# Patient Record
Sex: Female | Born: 1980 | Race: Black or African American | Hispanic: No | Marital: Single | Smoking: Never smoker
Health system: Southern US, Community
[De-identification: ages and names within clinical notes are randomized; demographics above are authoritative.]

## PROBLEM LIST (undated history)

## (undated) ENCOUNTER — Inpatient Hospital Stay (HOSPITAL_COMMUNITY): Payer: Self-pay

## (undated) DIAGNOSIS — G43909 Migraine, unspecified, not intractable, without status migrainosus: Secondary | ICD-10-CM

## (undated) DIAGNOSIS — Z789 Other specified health status: Secondary | ICD-10-CM

## (undated) DIAGNOSIS — Q8501 Neurofibromatosis, type 1: Secondary | ICD-10-CM

## (undated) DIAGNOSIS — G43009 Migraine without aura, not intractable, without status migrainosus: Secondary | ICD-10-CM

## (undated) HISTORY — PX: DILATION AND CURETTAGE OF UTERUS: SHX78

## (undated) HISTORY — PX: TONSILLECTOMY: SUR1361

## (undated) HISTORY — DX: Neurofibromatosis, type 1: Q85.01

## (undated) HISTORY — DX: Migraine, unspecified, not intractable, without status migrainosus: G43.909

## (undated) HISTORY — DX: Migraine without aura, not intractable, without status migrainosus: G43.009

## (undated) HISTORY — PX: NASAL FRACTURE SURGERY: SHX718

---

## 2009-02-24 ENCOUNTER — Ambulatory Visit (HOSPITAL_COMMUNITY): Admission: RE | Admit: 2009-02-24 | Discharge: 2009-02-24 | Payer: Self-pay | Admitting: Obstetrics and Gynecology

## 2010-06-06 LAB — CBC
HCT: 38.6 % (ref 36.0–46.0)
Hemoglobin: 12.8 g/dL (ref 12.0–15.0)
MCV: 87.6 fL (ref 78.0–100.0)
Platelets: 221 10*3/uL (ref 150–400)
RDW: 14.4 % (ref 11.5–15.5)
WBC: 5.4 10*3/uL (ref 4.0–10.5)

## 2010-06-10 ENCOUNTER — Emergency Department (HOSPITAL_COMMUNITY)
Admission: EM | Admit: 2010-06-10 | Discharge: 2010-06-10 | Disposition: A | Discharge status: Home / Self Care | Payer: Medicaid Other | Attending: Emergency Medicine | Admitting: Emergency Medicine

## 2010-06-10 DIAGNOSIS — R599 Enlarged lymph nodes, unspecified: Secondary | ICD-10-CM | POA: Insufficient documentation

## 2010-06-10 DIAGNOSIS — H9209 Otalgia, unspecified ear: Secondary | ICD-10-CM | POA: Insufficient documentation

## 2010-06-10 DIAGNOSIS — S139XXA Sprain of joints and ligaments of unspecified parts of neck, initial encounter: Secondary | ICD-10-CM | POA: Insufficient documentation

## 2010-06-10 DIAGNOSIS — R21 Rash and other nonspecific skin eruption: Secondary | ICD-10-CM | POA: Insufficient documentation

## 2010-06-10 DIAGNOSIS — L293 Anogenital pruritus, unspecified: Secondary | ICD-10-CM | POA: Insufficient documentation

## 2010-06-10 DIAGNOSIS — N949 Unspecified condition associated with female genital organs and menstrual cycle: Secondary | ICD-10-CM | POA: Insufficient documentation

## 2010-06-10 DIAGNOSIS — X503XXA Overexertion from repetitive movements, initial encounter: Secondary | ICD-10-CM | POA: Insufficient documentation

## 2010-06-10 DIAGNOSIS — M542 Cervicalgia: Secondary | ICD-10-CM | POA: Insufficient documentation

## 2010-06-12 LAB — WOUND CULTURE

## 2010-09-25 ENCOUNTER — Emergency Department (HOSPITAL_COMMUNITY)
Admission: EM | Admit: 2010-09-25 | Discharge: 2010-09-25 | Disposition: A | Discharge status: Home / Self Care | Payer: No Typology Code available for payment source | Attending: Emergency Medicine | Admitting: Emergency Medicine

## 2010-09-25 DIAGNOSIS — S335XXA Sprain of ligaments of lumbar spine, initial encounter: Secondary | ICD-10-CM | POA: Insufficient documentation

## 2010-11-22 ENCOUNTER — Emergency Department (HOSPITAL_COMMUNITY)
Admission: EM | Admit: 2010-11-22 | Discharge: 2010-11-23 | Disposition: A | Discharge status: Home / Self Care | Payer: Medicaid Other | Attending: Emergency Medicine | Admitting: Emergency Medicine

## 2010-11-22 DIAGNOSIS — R51 Headache: Secondary | ICD-10-CM | POA: Insufficient documentation

## 2010-11-22 DIAGNOSIS — H53149 Visual discomfort, unspecified: Secondary | ICD-10-CM | POA: Insufficient documentation

## 2010-11-22 DIAGNOSIS — J3489 Other specified disorders of nose and nasal sinuses: Secondary | ICD-10-CM | POA: Insufficient documentation

## 2011-12-07 LAB — OB RESULTS CONSOLE ABO/RH

## 2011-12-07 LAB — OB RESULTS CONSOLE GC/CHLAMYDIA
Chlamydia: NEGATIVE
Gonorrhea: NEGATIVE

## 2011-12-07 LAB — OB RESULTS CONSOLE RUBELLA ANTIBODY, IGM: Rubella: IMMUNE

## 2011-12-07 LAB — OB RESULTS CONSOLE ANTIBODY SCREEN: Antibody Screen: NEGATIVE

## 2011-12-07 LAB — OB RESULTS CONSOLE HEPATITIS B SURFACE ANTIGEN: Hepatitis B Surface Ag: NEGATIVE

## 2011-12-26 ENCOUNTER — Encounter (HOSPITAL_COMMUNITY): Payer: Self-pay | Admitting: Neurology

## 2011-12-26 ENCOUNTER — Emergency Department (HOSPITAL_COMMUNITY)
Admission: EM | Admit: 2011-12-26 | Discharge: 2011-12-26 | Disposition: A | Discharge status: Home / Self Care | Payer: Medicaid Other | Attending: Emergency Medicine | Admitting: Emergency Medicine

## 2011-12-26 DIAGNOSIS — O210 Mild hyperemesis gravidarum: Secondary | ICD-10-CM | POA: Insufficient documentation

## 2011-12-26 DIAGNOSIS — R1110 Vomiting, unspecified: Secondary | ICD-10-CM

## 2011-12-26 DIAGNOSIS — O2620 Pregnancy care for patient with recurrent pregnancy loss, unspecified trimester: Secondary | ICD-10-CM | POA: Insufficient documentation

## 2011-12-26 DIAGNOSIS — R109 Unspecified abdominal pain: Secondary | ICD-10-CM | POA: Insufficient documentation

## 2011-12-26 DIAGNOSIS — Z3490 Encounter for supervision of normal pregnancy, unspecified, unspecified trimester: Secondary | ICD-10-CM

## 2011-12-26 LAB — URINALYSIS, ROUTINE W REFLEX MICROSCOPIC
Bilirubin Urine: NEGATIVE
Ketones, ur: NEGATIVE mg/dL
Leukocytes, UA: NEGATIVE
Nitrite: NEGATIVE
Urobilinogen, UA: 0.2 mg/dL (ref 0.0–1.0)
pH: 6.5 (ref 5.0–8.0)

## 2011-12-26 LAB — POCT I-STAT 3, ART BLOOD GAS (G3+)
Acid-base deficit: 6 mmol/L — ABNORMAL HIGH (ref 0.0–2.0)
Bicarbonate: 20.6 mEq/L (ref 20.0–24.0)
O2 Saturation: 74 %
TCO2: 22 mmol/L (ref 0–100)
pO2, Arterial: 45 mmHg — ABNORMAL LOW (ref 80.0–100.0)

## 2011-12-26 LAB — D-DIMER, QUANTITATIVE: D-Dimer, Quant: <0.27 ug/mL-FEU (ref 0.00–0.48)

## 2011-12-26 LAB — POCT I-STAT, CHEM 8
Calcium, Ion: 1.23 mmol/L (ref 1.12–1.23)
Chloride: 104 mEq/L (ref 96–112)
Glucose, Bld: 89 mg/dL (ref 70–99)
HCT: 39 % (ref 36.0–46.0)
Hemoglobin: 13.3 g/dL (ref 12.0–15.0)
TCO2: 23 mmol/L (ref 0–100)

## 2011-12-26 LAB — URINE MICROSCOPIC-ADD ON

## 2011-12-26 NOTE — ED Notes (Signed)
Pt reporting [redacted] weeks pregnant, abdominal cramping since early this morning, denying any vaginal bleeding. C/o nausea this morning worse than normal. Reporting small amount of blood in emesis this morning. 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

## 2011-12-26 NOTE — ED Provider Notes (Signed)
History     CSN: [id number]  Arrival date & time 12/26/11  9147   First MD Initiated Contact with Patient 12/26/11 [phone]      Chief Complaint  Patient presents with  . Abdominal Cramping    [redacted] weeks pregnant   . Morning Sickness    (Consider location/radiation/quality/duration/timing/severity/associated sxs/prior treatment) HPI Comments: Patient's 31 year old female G75P0 (ABO/RH- O Pos) presents emergency department opening of abdominal cramping and morning emesis.  Patient states concerned because of her previous miscarriages.  She denies any vaginal bleeding, abdominal pain, fever, night sweats, chills, dysuria, urinary frequency, vaginal discharge, syncope, headache, or lightheadedness.  Patient has no other complaints at this time.  The history is provided by the patient.    History reviewed. No pertinent past medical history.  Past Surgical History  Procedure Date  . Dilation and curettage of uterus   . Tonsillectomy     No family history on file.  History  Substance Use Topics  . Smoking status: Never Smoker   . Smokeless tobacco: Not on file  . Alcohol Use: No    OB History    Grav Para Term Preterm Abortions TAB SAB Ect Mult Living   1               Review of Systems  Constitutional: Negative for fever, chills and appetite change.  HENT: Negative for congestion.   Eyes: Negative for visual disturbance.  Respiratory: Negative for shortness of breath.   Cardiovascular: Negative for chest pain and leg swelling.  Gastrointestinal: Positive for nausea and vomiting. Negative for abdominal pain (+ for lower abdominal cramping ), diarrhea, constipation, blood in stool and anal bleeding.  Genitourinary: Negative for dysuria, urgency, frequency, vaginal bleeding, vaginal discharge and vaginal pain.  Neurological: Negative for dizziness, syncope, weakness, light-headedness, numbness and headaches.  Psychiatric/Behavioral: Negative for confusion.    Allergies    Review of patient's allergies indicates no known allergies.  Home Medications   Current Outpatient Rx  Name Route Sig Dispense Refill  . ACETAMINOPHEN 500 MG PO TABS Oral Take 1,000 mg by mouth every 6 (six) hours as needed. For pain    . PRENATAL MULTIVITAMIN CH Oral Take 1 tablet by mouth daily.    Marland Kitchen PROMETHAZINE HCL 25 MG PO TABS Oral Take 25 mg by mouth at bedtime as needed. For nausea      BP 110/72  Pulse 80  Temp 98 F (36.7 C) (Oral)  Resp 14  SpO2 100%  Physical Exam  Nursing note and vitals reviewed. Constitutional: She is oriented to person, place, and time. She appears well-developed and well-nourished. No distress.  HENT:  Head: Normocephalic and atraumatic.  Mouth/Throat: Oropharynx is clear and moist. No oropharyngeal exudate.  Eyes: Conjunctivae normal and EOM are normal. Pupils are equal, round, and reactive to light. No scleral icterus.  Neck: Normal range of motion. Neck supple. No tracheal deviation present. No thyromegaly present.  Cardiovascular: Normal rate, regular rhythm, normal heart sounds and intact distal pulses.   Pulmonary/Chest: Effort normal and breath sounds normal. No stridor. No respiratory distress. She has no wheezes.  Abdominal: Soft.       Soft, nontender  Genitourinary:       Pelvic, non tender exam with closed cervical os. No abnormal dc.   Musculoskeletal: Normal range of motion. She exhibits no edema and no tenderness.  Neurological: She is alert and oriented to person, place, and time. Coordination normal.  Skin: Skin is warm and dry. No rash  noted. She is not diaphoretic. No erythema. No pallor.  Psychiatric: She has a normal mood and affect. Her behavior is normal.    ED Course  [place] bedside Date/Time: 12/26/2011 10:57 AM Performed by: Jaci Carrel Authorized by: Jaci Carrel Risks and benefits: risks, benefits and alternatives were discussed Consent given by: patient Patient understanding: patient states understanding of the  procedure being performed Patient consent: the patient's understanding of the procedure matches consent given Patient identity confirmed: verbally with patient and arm band Local anesthesia used: no Patient sedated: no Comments: Intrauterine pregnancy seen. With HR appearing to be >150. Active fetal movement. Procedure performed with attending, Dr. Radford Pax   (including critical care time)  Labs Reviewed  URINALYSIS, ROUTINE W REFLEX MICROSCOPIC - Abnormal; Notable for the following:    Color, Urine AMBER (*)  BIOCHEMICALS MAY BE AFFECTED BY COLOR   APPearance CLOUDY (*)     Specific Gravity, Urine 1.034 (*)     Hgb urine dipstick SMALL (*)     Protein, ur 30 (*)     All other components within normal limits  POCT I-STAT, CHEM 8 - Abnormal; Notable for the following:    BUN 4 (*)     All other components within normal limits  URINE MICROSCOPIC-ADD ON - Abnormal; Notable for the following:    Squamous Epithelial / LPF FEW (*)     Bacteria, UA FEW (*)     All other components within normal limits  D-DIMER, QUANTITATIVE   No results found.   No diagnosis found.  Component     Latest Ref Rng 02/24/2009  ABO/RH(D)      O POS    MDM  Abdominal cramping, nausea morning sickness  31 year old female G4 P0 presents emergency department [redacted] weeks pregnant concerned for abdominal cramping and nausea.  Patient denies any vaginal bleeding.  On exam cervical os closed, no abdominal tenderness and intrauterine pregnancy seen using bedside ultrasound.  Patient has been advised to followup with her OB/GYN. [facility] incase other concerns present throughout pregnancy. Pt is agreeable with plan and appears reliable for follow up          Jaci Carrel, PA-C 12/26/11 1121

## 2011-12-28 NOTE — ED Provider Notes (Signed)
Medical screening examination/treatment/procedure(s) were performed by non-physician practitioner and as supervising physician I was immediately available for consultation/collaboration.    Nelia Shi, MD 12/28/11 [phone]

## 2012-03-06 NOTE — L&D Delivery Note (Signed)
Delivery Note  SVD viable female Apgars 8,9 over 2nd degree ML lac.  Repair with 2-0 Chromic with good support and hemostasis noted and R/V exam confirms.  PH art was sent.  [place] cord blood was not done After 30 mnutes of traction,and uterine massage, the placenta had not budged.  Had cord avulse at perineum due to traction.  Pt unable to tolerate manual extraction due to discomfort and no epidural so R&B discussed and informed consent obtained for D&C for retained placenta.  Bleeding at this time is minimal.  Mother and baby were doing well.  EBL 350cc  Candice Camp, MD

## 2012-03-29 ENCOUNTER — Inpatient Hospital Stay (HOSPITAL_COMMUNITY)
Admission: AD | Admit: 2012-03-29 | Discharge: 2012-03-29 | Disposition: A | Discharge status: Home / Self Care | Payer: Medicaid Other | Source: Ambulatory Visit | Attending: Obstetrics and Gynecology | Admitting: Obstetrics and Gynecology

## 2012-03-29 ENCOUNTER — Encounter (HOSPITAL_COMMUNITY): Payer: Self-pay | Admitting: *Deleted

## 2012-03-29 DIAGNOSIS — O99891 Other specified diseases and conditions complicating pregnancy: Secondary | ICD-10-CM | POA: Insufficient documentation

## 2012-03-29 DIAGNOSIS — J069 Acute upper respiratory infection, unspecified: Secondary | ICD-10-CM | POA: Insufficient documentation

## 2012-03-29 LAB — URINALYSIS, ROUTINE W REFLEX MICROSCOPIC
Glucose, UA: NEGATIVE mg/dL
Ketones, ur: NEGATIVE mg/dL
Leukocytes, UA: NEGATIVE
Protein, ur: NEGATIVE mg/dL
pH: 6 (ref 5.0–8.0)

## 2012-03-29 LAB — URINE MICROSCOPIC-ADD ON

## 2012-03-29 MED ORDER — DIPHENHYDRAMINE HCL 25 MG PO CAPS: 50.0000 mg | ORAL_CAPSULE | Freq: Every evening | ORAL | Status: DC | PRN

## 2012-03-29 NOTE — MAU Provider Note (Signed)
Chief Complaint:  Influenza and Back Pain  First Provider Initiated Contact with Patient 03/29/12 2226     HPI: Melissa Mathews is a 32 y.o. G1P0 at [redacted]w[redacted]d who presents to maternity admissions reporting sore throat, fatigue, nasal congestion, cough, sneezing, dull HA. Denies fever, body aches, N/V/D, contractions, leakage of fluid, vaginal bleeding. Family members have had similar Sx, no fever. Good fetal movement. Called physicians or [facility]: Sx. Instructed to take Mucinex. To improvement.   Past Medical History: No past medical history on file.  Past obstetric history: OB History    Grav Para Term Preterm Abortions TAB SAB Ect Mult Living   6 2 2  3  3   2      # Outc Date GA Lbr Len/2nd Wgt Sex Del Anes PTL Lv   1 TRM 1996    F SVD None  Yes   2 TRM 2000    F SVD None  Yes   3 SAB            4 SAB            5 SAB            6 CUR               Past Surgical History: Past Surgical History  Procedure Date  . Dilation and curettage of uterus   . Tonsillectomy     Family History: No family history on file.  Social History: History  Substance Use Topics  . Smoking status: Never Smoker   . Smokeless tobacco: Not on file  . Alcohol Use: No    Allergies: No Known Allergies  Meds:  No prescriptions prior to admission    ROS: Pertinent findings in history of present illness.  Physical Exam  Blood pressure 120/61, pulse 79, temperature 98.1 F (36.7 C), temperature source Oral, resp. rate 20, height 5\' 2"  (1.575 m), weight 64.864 kg (143 lb). GENERAL: Well-developed, well-nourished female in mild distress.  HEENT: normocephalic. + rhinorrhea, nasal congestion, mild frontal sinus tenderness, mild throat erythema. Infrequent coughing. HEART: normal rate and rhythm, systolic murmur RESP: normal effort, lungs CTAB ABDOMEN: Soft, non-tender, gravid appropriate for gestational age EXTREMITIES: Nontender, no edema NEURO: alert and oriented SPECULUM EXAM: deferred   FHT:  152   Labs: Results for orders placed during the hospital encounter of 03/29/12 (from the past 24 hour(s))  URINALYSIS, ROUTINE W REFLEX MICROSCOPIC     Status: Abnormal   Collection Time   03/29/12 10:05 PM      Component Value Range   Color, Urine YELLOW  YELLOW   APPearance CLEAR  CLEAR   Specific Gravity, Urine >1.030 (*) 1.005 - 1.030   pH 6.0  5.0 - 8.0   Glucose, UA NEGATIVE  NEGATIVE mg/dL   Hgb urine dipstick TRACE (*) NEGATIVE   Bilirubin Urine NEGATIVE  NEGATIVE   Ketones, ur NEGATIVE  NEGATIVE mg/dL   Protein, ur NEGATIVE  NEGATIVE mg/dL   Urobilinogen, UA 1.0  0.0 - 1.0 mg/dL   Nitrite NEGATIVE  NEGATIVE   Leukocytes, UA NEGATIVE  NEGATIVE  URINE MICROSCOPIC-ADD ON     Status: Abnormal   Collection Time   03/29/12 10:05 PM      Component Value Range   Squamous Epithelial / LPF RARE  RARE   WBC, UA 0-2  <3 WBC/hpf   RBC / HPF 3-6  <3 RBC/hpf   Bacteria, UA FEW (*) RARE    Imaging:  No  results found.  MAU Course: Does not meet criteria for influenza. Discussed w/ Dr. Vincente Poli. No further orders.  Assessment: 1. URI (upper respiratory infection)   2. Other current maternal conditions classifiable elsewhere, antepartum     Plan: Discharge home Continue Mucinex. Discussed how it works. Benadryl for sleep. Increase rest and fluids. Preterm labor precautions and fetal kick counts     Follow-up Information    Follow up with GREWAL,MICHELLE L, MD. (as scheduled. Call the office if you develop a fever greater than 100.4.)    Contact information:   [address] [place] [address] [phone]           Medication List     As of 03/30/2012  2:21 AM    TAKE these medications         acetaminophen 500 MG tablet   Commonly known as: TYLENOL   Take 1,000 mg by mouth every 6 (six) hours as needed. For pain      diphenhydrAMINE 25 mg capsule   Commonly known as: BENADRYL   Take 2 capsules (50 mg total) by mouth at bedtime as needed for  sleep.      guaiFENesin 600 MG 12 hr tablet   Commonly known as: MUCINEX   Take 1,200 mg by mouth 2 (two) times daily. For cold symptoms      prenatal multivitamin Tabs   Take 1 tablet by mouth daily.         [place], CNM 03/29/2012 11:42 PM

## 2012-03-29 NOTE — MAU Note (Signed)
Having flu-like symptoms. Sore throat, cough, sneezing, sinus area pressure and h/a. Low back pain

## 2012-03-30 ENCOUNTER — Encounter (HOSPITAL_COMMUNITY): Payer: Self-pay | Admitting: Advanced Practice Midwife

## 2012-07-21 ENCOUNTER — Inpatient Hospital Stay (HOSPITAL_COMMUNITY)
Admission: AD | Admit: 2012-07-21 | Discharge: 2012-07-22 | Disposition: A | Discharge status: Home / Self Care | Payer: Medicaid Other | Source: Ambulatory Visit | Attending: Obstetrics and Gynecology | Admitting: Obstetrics and Gynecology

## 2012-07-21 ENCOUNTER — Encounter (HOSPITAL_COMMUNITY): Payer: Self-pay | Admitting: *Deleted

## 2012-07-21 DIAGNOSIS — O479 False labor, unspecified: Secondary | ICD-10-CM | POA: Insufficient documentation

## 2012-07-21 DIAGNOSIS — H538 Other visual disturbances: Secondary | ICD-10-CM | POA: Insufficient documentation

## 2012-07-21 DIAGNOSIS — R51 Headache: Secondary | ICD-10-CM | POA: Insufficient documentation

## 2012-07-21 DIAGNOSIS — O99891 Other specified diseases and conditions complicating pregnancy: Secondary | ICD-10-CM | POA: Insufficient documentation

## 2012-07-21 HISTORY — DX: Other specified health status: Z78.9

## 2012-07-21 LAB — URINALYSIS, ROUTINE W REFLEX MICROSCOPIC
Bilirubin Urine: NEGATIVE
Ketones, ur: NEGATIVE mg/dL
Leukocytes, UA: NEGATIVE
Nitrite: NEGATIVE
Specific Gravity, Urine: 1.025 (ref 1.005–1.030)
Urobilinogen, UA: 0.2 mg/dL (ref 0.0–1.0)
pH: 6.5 (ref 5.0–8.0)

## 2012-07-21 NOTE — MAU Provider Note (Signed)
History     CSN: [id number]  Arrival date and time: 07/21/12 2121   First Provider Initiated Contact with Patient 07/21/12 2233      Chief Complaint  Patient presents with  . Headache   HPI [name] is a 32 y.o. [id number] at [redacted]w[redacted]d who presents to MAU today with complaint of headache and blurred vision. The patient reports seeing spots x 2 weeks. Was told in office recently that this may be of concern if she developed headache. Headache started around 8 pm as did the blurry vision. She rates her pain at 2/10 upon arrival in MAU and notes improvement since then. She denies a history of HTN. She denies abdominal pain, vaginal bleeding or LOF. She had some mucus discharge over the weekend that has stopped. She is having some pressure in her lower abdomen that is unchanged since previously. She states that she has swelling in her extremities. She has occasional contractions. None today. Reports good fetal movement.    OB History   Grav Para Term Preterm Abortions TAB SAB Ect Mult Living   6 2 2  3  3   2       Past Medical History  Diagnosis Date  . Medical history non-contributory     Past Surgical History  Procedure Laterality Date  . Dilation and curettage of uterus    . Tonsillectomy      No family history on file.  History  Substance Use Topics  . Smoking status: Never Smoker   . Smokeless tobacco: Not on file  . Alcohol Use: No    Allergies: No Known Allergies  No prescriptions prior to admission    Review of Systems  Constitutional: Negative for fever and malaise/fatigue.  Eyes: Positive for blurred vision.  Gastrointestinal: Positive for nausea. Negative for vomiting, abdominal pain, diarrhea and constipation.  Genitourinary: Negative for dysuria, urgency and frequency.       Neg - vaginal bleeding, LOF + vaginal discharge  Neurological: Positive for dizziness and headaches. Negative for loss of consciousness.   Physical Exam   Blood pressure  109/61, pulse 82, temperature 98.4 F (36.9 C), temperature source Oral, resp. rate 18, height 5\' 2"  (1.575 m), weight 166 lb (75.297 kg), SpO2 100.00%.  Physical Exam  Constitutional: She appears well-developed and well-nourished. No distress.  HENT:  Head: Normocephalic and atraumatic.  Cardiovascular: Normal rate, regular rhythm and normal heart sounds.   Respiratory: Effort normal and breath sounds normal. No respiratory distress.  GI: Soft. Bowel sounds are normal. She exhibits no distension and no mass. There is no tenderness. There is no rebound and no guarding.  Neurological: She is alert. She has normal reflexes.  No clonus  Skin: Skin is warm and dry.  Psychiatric: She has a normal mood and affect.   Results for orders placed during the hospital encounter of 07/21/12 (from the past 24 hour(s))  URINALYSIS, ROUTINE W REFLEX MICROSCOPIC     Status: None   Collection Time    07/21/12  9:34 PM      Result Value Range   Color, Urine YELLOW  YELLOW   APPearance CLEAR  CLEAR   Specific Gravity, Urine 1.025  1.005 - 1.030   pH 6.5  5.0 - 8.0   Glucose, UA NEGATIVE  NEGATIVE mg/dL   Hgb urine dipstick NEGATIVE  NEGATIVE   Bilirubin Urine NEGATIVE  NEGATIVE   Ketones, ur NEGATIVE  NEGATIVE mg/dL   Protein, ur NEGATIVE  NEGATIVE mg/dL  Urobilinogen, UA 0.2  0.0 - 1.0 mg/dL   Nitrite NEGATIVE  NEGATIVE   Leukocytes, UA NEGATIVE  NEGATIVE   Fetal monitoring: Baseline: 120 bpm, moderate variability, + accelerations, no decelerations Contractions: few, occasional  MAU Course  Procedures None  MDM Discussed with Dr. Thana Ates. Ok to discharge. Follow-up as scheduled  Assessment and Plan  A: Headache  P: Discharge home Patient instructed to take Tylenol PRN for headache Patient advised to follow-up in the office as scheduled Pre-clampsia warning signs reviewed and given on AVS Labor precautions reviewed Patient may return to MAU as needed or if her condition were to  change or worsen  Freddi Starr, PA-C  07/22/2012, 12:30 AM

## 2012-07-21 NOTE — MAU Note (Signed)
Pt reports she has a headache on the left side of her head for the last hour, has had blurred vision and flashes of light for a week or more. Denies B/P problems with preg.

## 2012-07-24 ENCOUNTER — Inpatient Hospital Stay (HOSPITAL_COMMUNITY)
Admission: RE | Admit: 2012-07-24 | Discharge: 2012-07-27 | DRG: 767 | Disposition: A | Discharge status: Home / Self Care | Payer: Medicaid Other | Source: Ambulatory Visit | Attending: Obstetrics and Gynecology | Admitting: Obstetrics and Gynecology

## 2012-07-24 ENCOUNTER — Encounter (HOSPITAL_COMMUNITY): Payer: Self-pay | Admitting: *Deleted

## 2012-07-24 DIAGNOSIS — O4100X0 Oligohydramnios, unspecified trimester, not applicable or unspecified: Principal | ICD-10-CM | POA: Diagnosis present

## 2012-07-24 DIAGNOSIS — Z9889 Other specified postprocedural states: Secondary | ICD-10-CM

## 2012-07-24 LAB — CBC
MCH: 29.3 pg (ref 26.0–34.0)
MCHC: 34.2 g/dL (ref 30.0–36.0)
MCV: 85.6 fL (ref 78.0–100.0)
Platelets: 203 10*3/uL (ref 150–400)
RBC: 4.1 MIL/uL (ref 3.87–5.11)

## 2012-07-24 MED ORDER — LACTATED RINGERS IV SOLN
INTRAVENOUS | Status: DC
  Administered 2012-07-24 – 2012-07-25 (×3): via INTRAVENOUS

## 2012-07-24 MED ORDER — TERBUTALINE SULFATE 1 MG/ML IJ SOLN: 0.2500 mg | Freq: Once | INTRAMUSCULAR | Status: AC | PRN

## 2012-07-24 MED ORDER — OXYTOCIN BOLUS FROM INFUSION: 500.0000 mL | INTRAVENOUS | Status: DC

## 2012-07-24 MED ORDER — OXYCODONE-ACETAMINOPHEN 5-325 MG PO TABS: 1.0000 | ORAL_TABLET | ORAL | Status: DC | PRN

## 2012-07-24 MED ORDER — LIDOCAINE HCL (PF) 1 % IJ SOLN
30.0000 mL | INTRAMUSCULAR | Status: DC | PRN
  Filled 2012-07-24: qty 30

## 2012-07-24 MED ORDER — ACETAMINOPHEN 325 MG PO TABS: 650.0000 mg | ORAL_TABLET | ORAL | Status: DC | PRN

## 2012-07-24 MED ORDER — FLEET ENEMA 7-19 GM/118ML RE ENEM: 1.0000 | ENEMA | RECTAL | Status: DC | PRN

## 2012-07-24 MED ORDER — CITRIC ACID-SODIUM CITRATE 334-500 MG/5ML PO SOLN
30.0000 mL | ORAL | Status: DC | PRN
  Administered 2012-07-25: 30 mL via ORAL
  Filled 2012-07-24: qty 15

## 2012-07-24 MED ORDER — ONDANSETRON HCL 4 MG/2ML IJ SOLN: 4.0000 mg | Freq: Four times a day (QID) | INTRAMUSCULAR | Status: DC | PRN

## 2012-07-24 MED ORDER — IBUPROFEN 600 MG PO TABS: 600.0000 mg | ORAL_TABLET | Freq: Four times a day (QID) | ORAL | Status: DC | PRN

## 2012-07-24 MED ORDER — MISOPROSTOL 25 MCG QUARTER TABLET
25.0000 ug | ORAL_TABLET | ORAL | Status: DC | PRN
  Administered 2012-07-24: 25 ug via VAGINAL
  Filled 2012-07-24: qty 0.25

## 2012-07-24 MED ORDER — OXYTOCIN 40 UNITS IN LACTATED RINGERS INFUSION - SIMPLE MED: 62.5000 mL/h | INTRAVENOUS | Status: DC

## 2012-07-24 MED ORDER — ZOLPIDEM TARTRATE 5 MG PO TABS
5.0000 mg | ORAL_TABLET | Freq: Every evening | ORAL | Status: DC | PRN
  Administered 2012-07-24: 5 mg via ORAL
  Filled 2012-07-24: qty 1

## 2012-07-24 MED ORDER — LACTATED RINGERS IV SOLN
500.0000 mL | INTRAVENOUS | Status: DC | PRN
  Administered 2012-07-24 – 2012-07-25 (×2): 300 mL via INTRAVENOUS

## 2012-07-24 NOTE — Progress Notes (Signed)
H and E dictated, pt at term with AFI< 3% for induction

## 2012-07-25 ENCOUNTER — Encounter (HOSPITAL_COMMUNITY): Payer: Self-pay | Admitting: Anesthesiology

## 2012-07-25 ENCOUNTER — Encounter (HOSPITAL_COMMUNITY)
Admission: RE | Disposition: A | Discharge status: Home / Self Care | Payer: Self-pay | Source: Ambulatory Visit | Attending: Obstetrics and Gynecology

## 2012-07-25 ENCOUNTER — Inpatient Hospital Stay (HOSPITAL_COMMUNITY): Payer: Medicaid Other | Admitting: Anesthesiology

## 2012-07-25 HISTORY — PX: DILATION AND CURETTAGE OF UTERUS: SHX78

## 2012-07-25 LAB — CBC
HCT: 30.2 % — ABNORMAL LOW (ref 36.0–46.0)
Hemoglobin: 10.1 g/dL — ABNORMAL LOW (ref 12.0–15.0)
MCH: 28.9 pg (ref 26.0–34.0)
MCV: 86.3 fL (ref 78.0–100.0)
RBC: 3.5 MIL/uL — ABNORMAL LOW (ref 3.87–5.11)
WBC: 17.3 10*3/uL — ABNORMAL HIGH (ref 4.0–10.5)

## 2012-07-25 LAB — TYPE AND SCREEN

## 2012-07-25 SURGERY — DILATION AND CURETTAGE
Anesthesia: General | Site: Vagina | Wound class: Clean Contaminated

## 2012-07-25 MED ORDER — ONDANSETRON HCL 4 MG/2ML IJ SOLN
INTRAMUSCULAR | Status: DC | PRN
  Administered 2012-07-25: 4 mg via INTRAVENOUS

## 2012-07-25 MED ORDER — PRENATAL MULTIVITAMIN CH: 1.0000 | ORAL_TABLET | Freq: Every day | ORAL | Status: DC

## 2012-07-25 MED ORDER — ONDANSETRON HCL 4 MG/2ML IJ SOLN
4.0000 mg | Freq: Four times a day (QID) | INTRAMUSCULAR | Status: DC | PRN
Start: 2012-07-25 — End: 2012-07-25

## 2012-07-25 MED ORDER — ONDANSETRON HCL 4 MG PO TABS: 4.0000 mg | ORAL_TABLET | ORAL | Status: DC | PRN

## 2012-07-25 MED ORDER — OXYTOCIN 40 UNITS IN LACTATED RINGERS INFUSION - SIMPLE MED
1.0000 m[IU]/min | INTRAVENOUS | Status: DC
  Administered 2012-07-25: 2 m[IU]/min via INTRAVENOUS
  Filled 2012-07-25: qty 1000

## 2012-07-25 MED ORDER — DIPHENHYDRAMINE HCL 50 MG/ML IJ SOLN: 12.5000 mg | Freq: Four times a day (QID) | INTRAMUSCULAR | Status: DC | PRN

## 2012-07-25 MED ORDER — IBUPROFEN 600 MG PO TABS
600.0000 mg | ORAL_TABLET | Freq: Four times a day (QID) | ORAL | Status: DC
  Administered 2012-07-25 – 2012-07-27 (×6): 600 mg via ORAL
  Filled 2012-07-25 (×6): qty 1

## 2012-07-25 MED ORDER — SUCCINYLCHOLINE CHLORIDE 20 MG/ML IJ SOLN
INTRAMUSCULAR | Status: AC
  Filled 2012-07-25: qty 10

## 2012-07-25 MED ORDER — SENNOSIDES-DOCUSATE SODIUM 8.6-50 MG PO TABS
2.0000 | ORAL_TABLET | Freq: Every day | ORAL | Status: DC
  Administered 2012-07-26 (×2): 2 via ORAL

## 2012-07-25 MED ORDER — TERBUTALINE SULFATE 1 MG/ML IJ SOLN
INTRAMUSCULAR | Status: AC
  Administered 2012-07-25: 0.25 mg via SUBCUTANEOUS
  Filled 2012-07-25: qty 1

## 2012-07-25 MED ORDER — DIPHENHYDRAMINE HCL 12.5 MG/5ML PO ELIX
12.5000 mg | ORAL_SOLUTION | Freq: Four times a day (QID) | ORAL | Status: DC | PRN
  Filled 2012-07-25: qty 5

## 2012-07-25 MED ORDER — LIDOCAINE HCL (CARDIAC) 20 MG/ML IV SOLN
INTRAVENOUS | Status: DC | PRN
  Administered 2012-07-25: 50 mg via INTRAVENOUS

## 2012-07-25 MED ORDER — DIPHENHYDRAMINE HCL 25 MG PO CAPS: 25.0000 mg | ORAL_CAPSULE | Freq: Four times a day (QID) | ORAL | Status: DC | PRN

## 2012-07-25 MED ORDER — ZOLPIDEM TARTRATE 5 MG PO TABS: 5.0000 mg | ORAL_TABLET | Freq: Every evening | ORAL | Status: DC | PRN

## 2012-07-25 MED ORDER — NALOXONE HCL 0.4 MG/ML IJ SOLN: 0.4000 mg | INTRAMUSCULAR | Status: DC | PRN

## 2012-07-25 MED ORDER — LIDOCAINE HCL (CARDIAC) 20 MG/ML IV SOLN
INTRAVENOUS | Status: AC
  Filled 2012-07-25: qty 5

## 2012-07-25 MED ORDER — WITCH HAZEL-GLYCERIN EX PADS: 1.0000 "application " | MEDICATED_PAD | CUTANEOUS | Status: DC | PRN

## 2012-07-25 MED ORDER — PHENYLEPHRINE 40 MCG/ML (10ML) SYRINGE FOR IV PUSH (FOR BLOOD PRESSURE SUPPORT): 80.0000 ug | PREFILLED_SYRINGE | INTRAVENOUS | Status: DC | PRN

## 2012-07-25 MED ORDER — PHENYLEPHRINE 40 MCG/ML (10ML) SYRINGE FOR IV PUSH (FOR BLOOD PRESSURE SUPPORT)
80.0000 ug | PREFILLED_SYRINGE | INTRAVENOUS | Status: DC | PRN
  Filled 2012-07-25: qty 5

## 2012-07-25 MED ORDER — CEFAZOLIN SODIUM-DEXTROSE 2-3 GM-% IV SOLR
INTRAVENOUS | Status: DC | PRN
  Administered 2012-07-25: 2 g via INTRAVENOUS

## 2012-07-25 MED ORDER — PRENATAL MULTIVITAMIN CH
1.0000 | ORAL_TABLET | Freq: Every day | ORAL | Status: DC
  Administered 2012-07-26: 1 via ORAL
  Filled 2012-07-25: qty 1

## 2012-07-25 MED ORDER — EPHEDRINE 5 MG/ML INJ
10.0000 mg | INTRAVENOUS | Status: DC | PRN
  Filled 2012-07-25: qty 4

## 2012-07-25 MED ORDER — TERBUTALINE SULFATE 1 MG/ML IJ SOLN: 0.2500 mg | Freq: Once | INTRAMUSCULAR | Status: DC | PRN

## 2012-07-25 MED ORDER — TERBUTALINE SULFATE 1 MG/ML IJ SOLN: 0.2500 mg | Freq: Once | INTRAMUSCULAR | Status: AC

## 2012-07-25 MED ORDER — OXYTOCIN 10 UNIT/ML IJ SOLN
INTRAMUSCULAR | Status: AC
  Filled 2012-07-25: qty 2

## 2012-07-25 MED ORDER — OXYCODONE-ACETAMINOPHEN 5-325 MG PO TABS
1.0000 | ORAL_TABLET | ORAL | Status: DC | PRN
  Administered 2012-07-25 – 2012-07-26 (×2): 2 via ORAL
  Administered 2012-07-26 (×2): 1 via ORAL
  Administered 2012-07-27: 2 via ORAL
  Filled 2012-07-25: qty 2
  Filled 2012-07-25 (×2): qty 1
  Filled 2012-07-25: qty 2
  Filled 2012-07-25 (×2): qty 1

## 2012-07-25 MED ORDER — PROPOFOL 10 MG/ML IV EMUL
INTRAVENOUS | Status: AC
  Filled 2012-07-25: qty 20

## 2012-07-25 MED ORDER — LACTATED RINGERS IV SOLN
INTRAVENOUS | Status: DC | PRN
  Administered 2012-07-25: 17:00:00 via INTRAVENOUS

## 2012-07-25 MED ORDER — PHENYLEPHRINE HCL 10 MG/ML IJ SOLN
INTRAMUSCULAR | Status: DC | PRN
  Administered 2012-07-25: 80 ug via INTRAVENOUS

## 2012-07-25 MED ORDER — FENTANYL CITRATE 0.05 MG/ML IJ SOLN
INTRAMUSCULAR | Status: AC
  Administered 2012-07-25: 50 ug via INTRAVENOUS
  Filled 2012-07-25: qty 2

## 2012-07-25 MED ORDER — SUCCINYLCHOLINE CHLORIDE 20 MG/ML IJ SOLN
INTRAMUSCULAR | Status: DC | PRN
  Administered 2012-07-25: 120 mg via INTRAVENOUS

## 2012-07-25 MED ORDER — TETANUS-DIPHTH-ACELL PERTUSSIS 5-2.5-18.5 LF-MCG/0.5 IM SUSP: 0.5000 mL | Freq: Once | INTRAMUSCULAR | Status: DC

## 2012-07-25 MED ORDER — OXYTOCIN 10 UNIT/ML IJ SOLN
INTRAMUSCULAR | Status: DC | PRN
  Administered 2012-07-25: 20 [IU] via INTRAMUSCULAR

## 2012-07-25 MED ORDER — PHENYLEPHRINE 40 MCG/ML (10ML) SYRINGE FOR IV PUSH (FOR BLOOD PRESSURE SUPPORT)
PREFILLED_SYRINGE | INTRAVENOUS | Status: AC
  Filled 2012-07-25: qty 5

## 2012-07-25 MED ORDER — LANOLIN HYDROUS EX OINT: TOPICAL_OINTMENT | CUTANEOUS | Status: DC | PRN

## 2012-07-25 MED ORDER — FENTANYL CITRATE 0.05 MG/ML IJ SOLN
50.0000 ug | Freq: Once | INTRAMUSCULAR | Status: AC
  Administered 2012-07-25: 50 ug via INTRAVENOUS
  Filled 2012-07-25: qty 2

## 2012-07-25 MED ORDER — METHYLERGONOVINE MALEATE 0.2 MG/ML IJ SOLN
INTRAMUSCULAR | Status: DC | PRN
  Administered 2012-07-25: 0.2 mg via INTRAMUSCULAR

## 2012-07-25 MED ORDER — EPHEDRINE 5 MG/ML INJ: 10.0000 mg | INTRAVENOUS | Status: DC | PRN

## 2012-07-25 MED ORDER — DIPHENHYDRAMINE HCL 50 MG/ML IJ SOLN: 12.5000 mg | INTRAMUSCULAR | Status: DC | PRN

## 2012-07-25 MED ORDER — BENZOCAINE-MENTHOL 20-0.5 % EX AERO
1.0000 "application " | INHALATION_SPRAY | CUTANEOUS | Status: DC | PRN
  Administered 2012-07-26: 1 via TOPICAL
  Filled 2012-07-25: qty 56

## 2012-07-25 MED ORDER — SODIUM CHLORIDE 0.9 % IJ SOLN: 9.0000 mL | INTRAMUSCULAR | Status: DC | PRN

## 2012-07-25 MED ORDER — MEASLES, MUMPS & RUBELLA VAC ~~LOC~~ INJ: 0.5000 mL | INJECTION | Freq: Once | SUBCUTANEOUS | Status: DC

## 2012-07-25 MED ORDER — FENTANYL CITRATE 0.05 MG/ML IJ SOLN
INTRAMUSCULAR | Status: AC
  Filled 2012-07-25: qty 2

## 2012-07-25 MED ORDER — FENTANYL 10 MCG/ML IV SOLN
INTRAVENOUS | Status: DC
  Administered 2012-07-25: 14:00:00 via INTRAVENOUS
  Filled 2012-07-25: qty 50

## 2012-07-25 MED ORDER — METHYLERGONOVINE MALEATE 0.2 MG/ML IJ SOLN
INTRAMUSCULAR | Status: AC
  Filled 2012-07-25: qty 1

## 2012-07-25 MED ORDER — FENTANYL CITRATE 0.05 MG/ML IJ SOLN
INTRAMUSCULAR | Status: DC | PRN
  Administered 2012-07-25 (×3): 50 ug via INTRAVENOUS

## 2012-07-25 MED ORDER — MIDAZOLAM HCL 5 MG/5ML IJ SOLN
INTRAMUSCULAR | Status: DC | PRN
  Administered 2012-07-25: 1 mg via INTRAVENOUS

## 2012-07-25 MED ORDER — PROPOFOL 10 MG/ML IV BOLUS
INTRAVENOUS | Status: DC | PRN
  Administered 2012-07-25: 180 mg via INTRAVENOUS

## 2012-07-25 MED ORDER — ONDANSETRON HCL 4 MG/2ML IJ SOLN: 4.0000 mg | INTRAMUSCULAR | Status: DC | PRN

## 2012-07-25 MED ORDER — FENTANYL 2.5 MCG/ML BUPIVACAINE 1/10 % EPIDURAL INFUSION (WH - ANES)
14.0000 mL/h | INTRAMUSCULAR | Status: DC | PRN
  Filled 2012-07-25: qty 125

## 2012-07-25 MED ORDER — ONDANSETRON HCL 4 MG/2ML IJ SOLN
INTRAMUSCULAR | Status: AC
  Filled 2012-07-25: qty 2

## 2012-07-25 MED ORDER — MEDROXYPROGESTERONE ACETATE 150 MG/ML IM SUSP: 150.0000 mg | INTRAMUSCULAR | Status: DC | PRN

## 2012-07-25 MED ORDER — LACTATED RINGERS IV SOLN
INTRAVENOUS | Status: DC
  Administered 2012-07-25 (×2): via INTRAUTERINE

## 2012-07-25 MED ORDER — CEFAZOLIN SODIUM-DEXTROSE 2-3 GM-% IV SOLR
INTRAVENOUS | Status: AC
  Filled 2012-07-25: qty 50

## 2012-07-25 MED ORDER — DIBUCAINE 1 % RE OINT
1.0000 "application " | TOPICAL_OINTMENT | RECTAL | Status: DC | PRN
  Administered 2012-07-27: 1 via RECTAL
  Filled 2012-07-25: qty 28

## 2012-07-25 MED ORDER — MIDAZOLAM HCL 2 MG/2ML IJ SOLN
INTRAMUSCULAR | Status: AC
  Filled 2012-07-25: qty 2

## 2012-07-25 MED ORDER — FENTANYL CITRATE 0.05 MG/ML IJ SOLN: 25.0000 ug | INTRAMUSCULAR | Status: DC | PRN

## 2012-07-25 MED ORDER — SIMETHICONE 80 MG PO CHEW: 80.0000 mg | CHEWABLE_TABLET | ORAL | Status: DC | PRN

## 2012-07-25 MED ORDER — LACTATED RINGERS IV SOLN: 500.0000 mL | Freq: Once | INTRAVENOUS | Status: DC

## 2012-07-25 SURGICAL SUPPLY — 19 items
CATH ROBINSON RED A/P 16FR (CATHETERS) ×3 IMPLANT
CLOTH BEACON ORANGE TIMEOUT ST (SAFETY) ×3 IMPLANT
DECANTER SPIKE VIAL GLASS SM (MISCELLANEOUS) ×3 IMPLANT
GLOVE BIO SURGEON STRL SZ8 (GLOVE) ×3 IMPLANT
GLOVE SURG ORTHO 8.0 STRL STRW (GLOVE) ×3 IMPLANT
GOWN STRL REIN XL XLG (GOWN DISPOSABLE) ×6 IMPLANT
KIT BERKELEY 1ST TRIMESTER 3/8 (MISCELLANEOUS) ×3 IMPLANT
NEEDLE SPNL 22GX3.5 QUINCKE BK (NEEDLE) ×3 IMPLANT
NS IRRIG 1000ML POUR BTL (IV SOLUTION) ×3 IMPLANT
PACK VAGINAL MINOR WOMEN LF (CUSTOM PROCEDURE TRAY) ×3 IMPLANT
PAD OB MATERNITY 4.3X12.25 (PERSONAL CARE ITEMS) ×3 IMPLANT
PAD PREP 24X48 CUFFED NSTRL (MISCELLANEOUS) ×3 IMPLANT
SET BERKELEY SUCTION TUBING (SUCTIONS) ×3 IMPLANT
SYR CONTROL 10ML LL (SYRINGE) ×3 IMPLANT
TOWEL OR 17X24 6PK STRL BLUE (TOWEL DISPOSABLE) ×6 IMPLANT
VACURETTE 10 RIGID CVD (CANNULA) IMPLANT
VACURETTE 7MM CVD STRL WRAP (CANNULA) IMPLANT
VACURETTE 8 RIGID CVD (CANNULA) IMPLANT
VACURETTE 9 RIGID CVD (CANNULA) IMPLANT

## 2012-07-25 NOTE — Progress Notes (Signed)
Patient ID: Melissa Mathews, female   DOB: 04-May-1980, 32 y.o.   MRN: [id number] Pt with only mild ctxs VSSAF FHR with late decels and decrease variablity thru night responded to IVF, Terb and dc cytotec FHR now with occas accels, no decels Ctxs mild q 5-10  Cx  1-2/50/-2 AROM minimal fluid IUPC placed  Oligo induction at term FHR reassuring at this time Augment with pitocin Consider amnioinfusion DL

## 2012-07-25 NOTE — Anesthesia Postprocedure Evaluation (Signed)
  Anesthesia Post-op Note  Patient: Melissa Mathews  Procedure(s) Performed: Procedure(s): DILATATION AND CURETTAGE (N/A) Patient is awake and responsive. Pain and nausea are reasonably well controlled. Vital signs are stable and clinically acceptable. Oxygen saturation is clinically acceptable. There are no apparent anesthetic complications at this time. Patient is ready for discharge.

## 2012-07-25 NOTE — Brief Op Note (Signed)
07/24/2012 - 07/25/2012  5:12 PM  PATIENT:  Melissa Mathews  32 y.o. female  PRE-OPERATIVE DIAGNOSIS:  retained placenta  POST-OPERATIVE DIAGNOSIS:  retained placenta  PROCEDURE:  Procedure(s): DILATATION AND EVACUATION (N/A)  SURGEON:  Surgeon(s) and Role:    * Turner Daniels, MD - Primary  PHYSICIAN ASSISTANT:   ASSISTANTS: none   ANESTHESIA:   general  EBL:  Total I/O In: 700 [I.V.:700] Out: 1850 [Urine:50; Blood:1800]  BLOOD ADMINISTERED:none  DRAINS: none   LOCAL MEDICATIONS USED:  NONE  SPECIMEN:  Source of Specimen:  placenta  DISPOSITION OF SPECIMEN:  PATHOLOGY  COUNTS:  YES  TOURNIQUET:  * No tourniquets in log *  DICTATION: .Other Dictation: Dictation Number 1  PLAN OF CARE: Admit to inpatient   PATIENT DISPOSITION:  PACU - hemodynamically stable.   Delay start of Pharmacological VTE agent (>24hrs) due to surgical blood loss or risk of bleeding: no

## 2012-07-25 NOTE — Transfer of Care (Signed)
Immediate Anesthesia Transfer of Care Note  Patient: Melissa Mathews  Procedure(s) Performed: Procedure(s): DILATATION AND EVACUATION (N/A)  Patient Location: PACU  Anesthesia Type:General  Level of Consciousness: awake  Airway & Oxygen Therapy: Patient Spontanous Breathing  Post-op Assessment: Report given to PACU RN  Post vital signs: stable  Filed Vitals:   07/25/12 1546  BP:   Pulse: 113  Temp:   Resp:     Complications: No apparent anesthesia complications

## 2012-07-25 NOTE — Anesthesia Preprocedure Evaluation (Signed)
Anesthesia Evaluation Anesthesia Physical Anesthesia Plan  ASA: II  Anesthesia Plan: Epidural   Post-op Pain Management:    Induction:   Airway Management Planned:   Additional Equipment:   Intra-op Plan:   Post-operative Plan:   Informed Consent: I have reviewed the patients History and Physical, chart, labs and discussed the procedure including the risks, benefits and alternatives for the proposed anesthesia with the patient or authorized representative who has indicated his/her understanding and acceptance.   Dental Advisory Given  Plan Discussed with:   Anesthesia Plan Comments: (Labs checked- platelets confirmed with RN in room. Fetal heart tracing, per RN, reported to be stable enough for sitting procedure. Discussed epidural, and patient consents to the procedure:  included risk of possible headache,backache, failed block, allergic reaction, and nerve injury. This patient was asked if she had any questions or concerns before the procedure started.)        Anesthesia Quick Evaluation  

## 2012-07-25 NOTE — Progress Notes (Signed)
Patient ID: Melissa Mathews, female   DOB: March 01, 1981, 32 y.o.   MRN: [id number] Waiting for OR for retained placenta due to another retained placenta ahead of [place], called to room for gush of blood -500cc.  Massaged with small clots, and called or and immediately went for D&C (stat). In OR total of 1000cc by time intubated and D&C started.  With 300cc from delivery, total EBL 1300cc OP note dictated DL

## 2012-07-25 NOTE — Anesthesia Preprocedure Evaluation (Signed)
Anesthesia Evaluation  Patient identified by MRN, date of birth, ID band Patient awake    Reviewed: Allergy & Precautions, H&P , Patient's Chart, lab work & pertinent test results, reviewed documented beta blocker date and time   Airway Mallampati: II TM Distance: >3 FB Neck ROM: full    Dental no notable dental hx.    Pulmonary  breath sounds clear to auscultation  Pulmonary exam normal       Cardiovascular Rhythm:regular Rate:Normal     Neuro/Psych    GI/Hepatic   Endo/Other    Renal/GU      Musculoskeletal   Abdominal   Peds  Hematology   Anesthesia Other Findings   Reproductive/Obstetrics                           Anesthesia Physical Anesthesia Plan  ASA: II and emergent  Anesthesia Plan: General   Post-op Pain Management:    Induction: Intravenous  Airway Management Planned: Oral ETT  Additional Equipment:   Intra-op Plan:   Post-operative Plan:   Informed Consent: I have reviewed the patients History and Physical, chart, labs and discussed the procedure including the risks, benefits and alternatives for the proposed anesthesia with the patient or authorized representative who has indicated his/her understanding and acceptance.   Dental Advisory Given and Dental advisory given  Plan Discussed with: CRNA and Surgeon  Anesthesia Plan Comments: (  Discussed  general anesthesia, including possible nausea, instrumentation of airway, sore throat,pulmonary aspiration, etc. I asked if the were any outstanding questions, or  concerns before we proceeded. )        Anesthesia Quick Evaluation

## 2012-07-25 NOTE — H&P (Signed)
NAMECHALET, KERWIN NO.:  [id number]  MEDICAL RECORD NO.:  [id number]  LOCATION:                                 FACILITY:  PHYSICIAN:  Duke Salvia. Marcelle Overlie, M.D.DATE OF BIRTH:  03-Jan-1981  DATE OF ADMISSION:  07/24/2012 DATE OF DISCHARGE:                             HISTORY & PHYSICAL   CHIEF COMPLAINT:  Low AFI at term.  HPI:  A 32 year old, G6, P2-0-3-2 at 39-6/7 weeks, was seen in the office today for NST and ultrasound NST was reactive.  Ultrasound BPP 6/8 vertex, however the AFI was less than the 3rd percentile, presents now for labor induction for low fluid at term, GBS negative.  1 hour GTT was normal.  Blood type is O positive.  PAST MEDICAL HISTORY:  Please see her [facility] form for the remainder of her past medical history.  PHYSICAL EXAMINATION:  Temp 98.2, blood pressure 130/70.  HEENT: Unremarkable.  NECK:  Supple without masses.  LUNGS:  Clear. CARDIOVASCULAR:  Regular rhythm without murmurs, rubs, or gallops. Breasts not examined.  Term fundal height.  Fetal heart rate 140, cervix was closed, soft vertex.  Extremities and neurologic exam are unremarkable.  IMPRESSION:  Oligohydramnios at term.  PLAN:  Two-stage labor induction procedure and protocol reviewed with the patient.     Kimberlly Norgard M. Marcelle Overlie, M.D.     [facility]/MEDQ  D:  07/24/2012  T:  07/24/2012  Job:  [id number]

## 2012-07-26 ENCOUNTER — Encounter (HOSPITAL_COMMUNITY): Payer: Self-pay | Admitting: *Deleted

## 2012-07-26 LAB — CBC
HCT: 24.1 % — ABNORMAL LOW (ref 36.0–46.0)
Hemoglobin: 8.1 g/dL — ABNORMAL LOW (ref 12.0–15.0)
MCH: 28.9 pg (ref 26.0–34.0)
MCHC: 33.6 g/dL (ref 30.0–36.0)
MCV: 86.1 fL (ref 78.0–100.0)
RDW: 16.1 % — ABNORMAL HIGH (ref 11.5–15.5)

## 2012-07-26 MED ORDER — FERROUS SULFATE 325 (65 FE) MG PO TABS
325.0000 mg | ORAL_TABLET | Freq: Three times a day (TID) | ORAL | Status: DC
  Administered 2012-07-26 – 2012-07-27 (×4): 325 mg via ORAL
  Filled 2012-07-26 (×4): qty 1

## 2012-07-26 NOTE — Progress Notes (Signed)
UR chart review completed.  

## 2012-07-26 NOTE — Op Note (Signed)
NAMESHERROL, VICARS NO.:  [id number]  MEDICAL RECORD NO.:  [id number]  LOCATION:  9105                          FACILITY:  [facility]  PHYSICIAN:  Dineen Kid. Rana Snare, M.D.    DATE OF BIRTH:  1981-02-13  DATE OF PROCEDURE:  07/25/2012 DATE OF DISCHARGE:                              OPERATIVE REPORT   PREOPERATIVE DIAGNOSIS:  Retained placenta status post vaginal delivery and also vaginal bleeding.  POSTOPERATIVE DIAGNOSIS:  Retained placenta status post vaginal delivery and also vaginal bleeding.  PROCEDURE:  Dilation and evacuation.  SURGEON:  Dineen Kid. Rana Snare, M.D.  ANESTHESIA:  General endotracheal.  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  Retained placenta for approximately 30 minutes, now with an avulsed cord after trying to remove.  Previous history of D and C in the past with no __________ the placenta __________ extracted manually due to the patient's discomfort.  The patient elects for operating room for general anesthesia and removal of retained placenta. We were asked to wait due to the operating room busy with another retained placenta, but after about 10 minutes of waiting, the patient began to have vaginal bleeding so we proceeded in a stat fashion.  At the time of presentation to the operating room, there was approximately 1000 mL of the original 500 mL in the recovery room, another 200 to 300 when we entered the operating room, and then 200 evacuation __________ dilation and curettage.  She had 300 mL total from the delivery, so the total blood loss today will be 1300 mL.  DESCRIPTION OF PROCEDURE:  After adequate analgesia, the patient had been placed in dorsal lithotomy position.  She was sterilely prepped and draped.  Bladder was sterilely drained.  After massaging the uterus to firm, a weighted speculum was placed, Sims  retractor.  The anterior lip of the cervix was grasped with a ring forceps.  The edge of the placenta was grasped with a ring forceps as well.  Operator's fingertips were inserted and I was able to separate the placenta from the uterine wall with the fingers and tease the edge of the placenta through the cervix while grasping it with ring forceps.  After delineating the plan with the operator's fingertips and gentle traction with ring forceps, I was able to completely remove the placenta, the cord had been avulsed, but there was approximately 20-30 cm of cord left at this time.  After complete removal of the placenta, manual exam under anesthesia of the uterus was normal with no palpable products remained,  gentle sharp curettage was carried out in addition, and no products were removed at this time.  The uterus was massaged to firm.  The patient was given Methergine 0.2 mg IM with good uterine response and the Pitocin was started with minimal bleeding noted.  The small separation of the episiotomy repair was reapproximated with interrupted suture of 2-0 chromic with good perineal repair noted and minimal bleeding noted.  The patient was then given 2 g of Ancef, and transferred to the recovery room in stable condition.  Total blood  loss including the vaginal delivery, the bleeding before the operating room, and in the operating room was 1300 mL.  We will check a hemoglobin in the recovery room before transfer to the floor.  At this time, she is stable.     Dineen Kid Rana Snare, M.D.     DCL/MEDQ  D:  07/25/2012  T:  07/26/2012  Job:  [id number]

## 2012-07-26 NOTE — Progress Notes (Signed)
Post Partum Day 1 and post op D and E  Subjective: up ad lib, voiding and tolerating PO  Objective: Blood pressure 95/56, pulse 103, temperature 98.2 F (36.8 C), temperature source Oral, resp. rate 16, height 5\' 2"  (1.575 m), weight 165 lb (74.844 kg), SpO2 97.00%, unknown if currently breastfeeding.  Physical Exam:  General: alert and cooperative Lochia: appropriate Uterine Fundus: firm Incision: perineum intact DVT Evaluation: No evidence of DVT seen on physical exam. Negative Homan's sign. No cords or calf tenderness. No significant calf/ankle edema.   Recent Labs  07/25/12 1750 07/26/12 0605  HGB 10.1* 8.1*  HCT 30.2* 24.1*    Assessment/Plan: Plan for discharge tomorrow FeSo4   LOS: 2 days   Melissa Mathews G 07/26/2012, 8:00 AM

## 2012-07-27 DIAGNOSIS — Z9889 Other specified postprocedural states: Secondary | ICD-10-CM

## 2012-07-27 LAB — CBC
MCH: 29.1 pg (ref 26.0–34.0)
MCHC: 33 g/dL (ref 30.0–36.0)
MCV: 88 fL (ref 78.0–100.0)
Platelets: 178 10*3/uL (ref 150–400)
RBC: 2.58 MIL/uL — ABNORMAL LOW (ref 3.87–5.11)

## 2012-07-27 MED ORDER — OXYCODONE-ACETAMINOPHEN 7.5-325 MG PO TABS: 1.0000 | ORAL_TABLET | ORAL | Status: DC | PRN

## 2012-07-27 NOTE — Discharge Summary (Signed)
Obstetric Discharge Summary Reason for Admission: induction of labor for oligohydramios Prenatal Procedures: ultrasound Intrapartum Procedures: spontaneous vaginal delivery Postpartum Procedures: curettage Complications-Operative and Postpartum: second degree perineal laceration and retained placenta Hemoglobin  Date Value Range Status  07/27/2012 7.5* 12.0 - 15.0 g/dL Final     HCT  Date Value Range Status  07/27/2012 22.7* 36.0 - 46.0 % Final    Physical Exam:  General: alert Lochia: appropriate Uterine Fundus: firm Incision: healing well DVT Evaluation: No evidence of DVT seen on physical exam.  Discharge Diagnoses: Term Pregnancy-delivered  Discharge Information: Date: 07/27/2012 Activity: pelvic rest Diet: routine Medications: PNV, Iron and Percocet Condition: stable Instructions: refer to practice specific booklet Discharge to: home   Newborn Data: Live born female  Birth Weight: 8 lb 4.3 oz (3750 g) APGAR: 8, 9  Home with mother.  Sheronda Parran S 07/27/2012, 9:39 AM

## 2012-07-27 NOTE — Progress Notes (Signed)
Post Partum Day two Subjective: no complaints  Objective: Blood pressure 90/52, pulse 73, temperature 98.1 F (36.7 C), temperature source Oral, resp. rate 16, height 5\' 2"  (1.575 m), weight 74.844 kg (165 lb), SpO2 97.00%, unknown if currently breastfeeding.  Physical Exam:  General: alert Lochia: appropriate Uterine Fundus: firm Incision: healing well DVT Evaluation: No evidence of DVT seen on physical exam.   Recent Labs  07/26/12 0605 07/27/12 0612  HGB 8.1* 7.5*  HCT 24.1* 22.7*    Assessment/Plan: Discharge home   LOS: 3 days   Hadi Dubin S 07/27/2012, 9:38 AM

## 2012-11-19 ENCOUNTER — Encounter (HOSPITAL_COMMUNITY): Payer: Self-pay | Admitting: Emergency Medicine

## 2012-11-19 ENCOUNTER — Emergency Department (HOSPITAL_COMMUNITY)
Admission: EM | Admit: 2012-11-19 | Discharge: 2012-11-19 | Disposition: A | Discharge status: Home / Self Care | Payer: Medicaid Other | Source: Home / Self Care | Attending: Emergency Medicine | Admitting: Emergency Medicine

## 2012-11-19 DIAGNOSIS — J069 Acute upper respiratory infection, unspecified: Secondary | ICD-10-CM

## 2012-11-19 DIAGNOSIS — M549 Dorsalgia, unspecified: Secondary | ICD-10-CM

## 2012-11-19 MED ORDER — HYDROCODONE-ACETAMINOPHEN 5-325 MG PO TABS: 2.0000 | ORAL_TABLET | ORAL | Status: DC | PRN

## 2012-11-19 MED ORDER — METHOCARBAMOL 500 MG PO TABS: 500.0000 mg | ORAL_TABLET | Freq: Two times a day (BID) | ORAL | Status: DC

## 2012-11-19 NOTE — ED Provider Notes (Signed)
CSN: [id number]     Arrival date & time 11/19/12  1834 History   First MD Initiated Contact with Patient 11/19/12 2020     Chief Complaint  Patient presents with  . Back Injury    last weekend during [employer] training.   Marland Kitchen URI    ha sore throat.  cough. sinus pressure and pain.    (Consider location/radiation/quality/duration/timing/severity/associated sxs/prior Treatment) Patient is a 32 y.o. female presenting with URI and back pain. The history is provided by the patient.  URI Presenting symptoms: congestion and cough   Severity:  Mild Onset quality:  Gradual Duration:  1 day Timing:  Constant Progression:  Worsening Chronicity:  New Relieved by:  Nothing Worsened by:  Nothing tried Associated symptoms: arthralgias   Back Pain Quality:  Aching Radiates to:  Does not radiate Pain severity:  Moderate Pain is:  Same all the time Onset quality:  Sudden Duration:  1 week Timing:  Constant Progression:  Worsening Chronicity:  New Worsened by:  Nothing tried Ineffective treatments:  None tried Associated symptoms: no abdominal pain     Past Medical History  Diagnosis Date  . Medical history non-contributory    Past Surgical History  Procedure Laterality Date  . Dilation and curettage of uterus    . Tonsillectomy    . Dilation and curettage of uterus N/A 07/25/2012    Procedure: DILATATION AND CURETTAGE;  Surgeon: Turner Daniels, MD;  Location: [facility];  Service: Gynecology;  Laterality: N/A;   History reviewed. No pertinent family history. History  Substance Use Topics  . Smoking status: Never Smoker   . Smokeless tobacco: Not on file  . Alcohol Use: No   OB History   Grav Para Term Preterm Abortions TAB SAB Ect Mult Living   6 3 3  3  3   3      Review of Systems  HENT: Positive for congestion.   Respiratory: Positive for cough.   Gastrointestinal: Negative for abdominal pain.  Musculoskeletal: Positive for back pain and arthralgias.  All other systems  reviewed and are negative.    Allergies  Review of patient's allergies indicates no known allergies.  Home Medications   Current Outpatient Rx  Name  Route  Sig  Dispense  Refill  . Prenatal Vit-Fe Fumarate-FA (PRENATAL MULTIVITAMIN) TABS   Oral   Take 1 tablet by mouth daily.         Marland Kitchen acetaminophen (TYLENOL) 500 MG tablet   Oral   Take 1,000 mg by mouth every 6 (six) hours as needed. For pain         . HYDROcodone-acetaminophen (NORCO/VICODIN) 5-325 MG per tablet   Oral   Take 2 tablets by mouth every 4 (four) hours as needed for pain.   20 tablet   0   . IRON PO   Oral   Take 1 tablet by mouth every morning. Unknown strength, pt received as samples from clinic         . methocarbamol (ROBAXIN) 500 MG tablet   Oral   Take 1 tablet (500 mg total) by mouth 2 (two) times daily.   20 tablet   0   . oxyCODONE-acetaminophen (PERCOCET) 7.5-325 MG per tablet   Oral   Take 1 tablet by mouth every 4 (four) hours as needed for pain.   30 tablet   0    BP 134/84  Pulse 112  Temp(Src) 98.4 F (36.9 C) (Oral)  Resp 16  SpO2 98%  LMP  10/09/2012  Breastfeeding? No Physical Exam  Nursing note and vitals reviewed. Constitutional: She is oriented to person, place, and time. She appears well-developed and well-nourished.  HENT:  Head: Normocephalic and atraumatic.  Mouth/Throat: Oropharynx is clear and moist.  Eyes: Conjunctivae and EOM are normal. Pupils are equal, round, and reactive to light.  Neck: Normal range of motion. Neck supple.  Cardiovascular: Normal rate.   Pulmonary/Chest: Effort normal and breath sounds normal.  Abdominal: Soft. She exhibits no distension.  Musculoskeletal:  Tender lumbar spine and paravertebrals  Neurological: She is alert and oriented to person, place, and time.  Skin: Skin is warm.  Psychiatric: She has a normal mood and affect.    ED Course  Procedures (including critical care time) Labs Review Labs Reviewed - No data to  display Imaging Review No results found.  MDM   1. Back pain   2. URI (upper respiratory infection)    Robaxin and hydrocodone follow up with Dr. Roda Shutters if symptoms persist    Elson Areas, [place] 11/19/12 2028

## 2012-11-19 NOTE — ED Provider Notes (Signed)
Medical screening examination/treatment/procedure(s) were performed by non-physician practitioner and as supervising physician I was immediately available for consultation/collaboration.  Leslee Home, M.D.  Reuben Likes, MD 11/19/12 [id number]

## 2012-11-19 NOTE — ED Notes (Signed)
C/o injury to back during [employer]. ?'s possible strain. Has tried ibuprofen 800 mg with no relief.   Cold symptoms: sinus pressure and pain. Headache. Nonproductive cough. Stuffy nose. Onset yesterday. Pt is taking mucinex with no relief.   Denies fever n/v/d

## 2013-02-19 ENCOUNTER — Emergency Department (HOSPITAL_COMMUNITY)
Admission: EM | Admit: 2013-02-19 | Discharge: 2013-02-20 | Disposition: A | Discharge status: Home / Self Care | Payer: Medicaid Other | Attending: Emergency Medicine | Admitting: Emergency Medicine

## 2013-02-19 ENCOUNTER — Encounter (HOSPITAL_COMMUNITY): Payer: Self-pay | Admitting: Emergency Medicine

## 2013-02-19 DIAGNOSIS — Z791 Long term (current) use of non-steroidal anti-inflammatories (NSAID): Secondary | ICD-10-CM | POA: Insufficient documentation

## 2013-02-19 DIAGNOSIS — M25579 Pain in unspecified ankle and joints of unspecified foot: Secondary | ICD-10-CM | POA: Insufficient documentation

## 2013-02-19 DIAGNOSIS — S93401S Sprain of unspecified ligament of right ankle, sequela: Secondary | ICD-10-CM

## 2013-02-19 DIAGNOSIS — G8911 Acute pain due to trauma: Secondary | ICD-10-CM | POA: Insufficient documentation

## 2013-02-19 NOTE — ED Notes (Signed)
Pt has been training for the [employer], she states she injured her right ankle and it has not gotten any better. She states it has been hurting for 2 weeks, she was given ibuprofen and naprosyn , but she states it is still hurting.

## 2013-02-20 ENCOUNTER — Emergency Department (HOSPITAL_COMMUNITY): Payer: Medicaid Other

## 2013-02-20 NOTE — Progress Notes (Signed)
Orthopedic Tech Progress Note Patient Details:  Melissa Mathews 1980-09-11 [id number]  Ortho Devices Type of Ortho Device: ASO   Haskell Flirt 02/20/2013, 1:18 AM

## 2013-02-20 NOTE — ED Provider Notes (Signed)
Medical screening examination/treatment/procedure(s) were performed by non-physician practitioner and as supervising physician I was immediately available for consultation/collaboration.  EKG Interpretation   None        Tareek Sabo, MD 02/20/13 2249 

## 2013-02-20 NOTE — ED Provider Notes (Signed)
CSN: [id number]     Arrival date & time 02/19/13  2126 History   First MD Initiated Contact with Patient 02/19/13 2349     Chief Complaint  Patient presents with  . 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  She was seen at the medical clinic at that time.  Was told it was a sprain.  Put in a brace.  She continue to train on her arrival.  Home.  She was seen by her primary care physician, who again told her it was a sprain.  He prescribed Naprosyn.  At that time.  She presented to the emergency room tonight with persistent pain, although she is, and the tori.  There is no swelling, or discoloration  Patient is a 32 y.o. Mathews presenting with ankle pain. The history is provided by the patient.  Ankle Pain Location:  Ankle Time since incident:  2 weeks Injury: yes   Ankle location:  R ankle Pain details:    Quality:  Aching   Radiates to:  Does not radiate   Severity:  Mild   Onset quality:  Sudden   Duration:  2 weeks   Timing:  Constant   Progression:  Unchanged Chronicity:  New Dislocation: yes   Tetanus status:  Up to date Prior injury to area:  No Relieved by:  Nothing Worsened by:  Activity Ineffective treatments:  Acetaminophen and immobilization Associated symptoms: no fever     Past Medical History  Diagnosis Date  . Medical history non-contributory    Past Surgical History  Procedure Laterality Date  . Dilation and curettage of uterus    . Tonsillectomy    . Dilation and curettage of uterus N/A 07/25/2012    Procedure: DILATATION AND CURETTAGE;  Surgeon: Turner Daniels, MD;  Location: [facility];  Service: Gynecology;  Laterality: N/A;   History reviewed. No pertinent family history. History  Substance Use Topics  . Smoking status: Never Smoker   . Smokeless tobacco: Not on file  . Alcohol Use:  No   OB History   Grav Para Term Preterm Abortions TAB SAB Ect Mult Living   6 3 3  3  3   3      Review of Systems  Constitutional: Negative for fever.  Musculoskeletal: Negative for joint swelling.  Skin: Negative for color change.  Neurological: Negative for weakness and numbness.  All other systems reviewed and are negative.    Allergies  Review of patient's allergies indicates no known allergies.  Home Medications   Current Outpatient Rx  Name  Route  Sig  Dispense  Refill  . acetaminophen (TYLENOL) 500 MG tablet   Oral   Take 1,000 mg by mouth every 6 (six) hours as needed. For pain         . naproxen (NAPROSYN) 500 MG tablet   Oral   Take 500 mg by mouth 2 (two) times daily with a meal.          BP 124/77  Pulse 74  Temp(Src) Melissa.6 F (37 C) (Oral)  Resp 16  SpO2 100%  LMP 02/19/2013 Physical Exam  Nursing note and vitals reviewed. Constitutional: She appears well-developed and well-nourished.  Eyes: Pupils are equal, round, and reactive to light.  Neck: Normal range of motion.  Cardiovascular: Normal rate and regular rhythm.   Pulmonary/Chest: Effort normal.  Musculoskeletal: She exhibits no edema  and no tenderness.  Neurological: She is alert.  Skin: Skin is warm and dry.    ED Course  Procedures (including critical care time) Labs Review Labs Reviewed - No data to display Imaging Review Dg Foot Complete Right  02/20/2013   CLINICAL DATA:  Twist injury, pain and swelling at 5th metatarsal and lateral ankle  EXAM: RIGHT FOOT COMPLETE - 3+ VIEW  COMPARISON:  None  FINDINGS: Osseous mineralization normal.  Joint spaces preserved.  No fracture, dislocation, or bone destruction.  IMPRESSION: Normal exam.   Electronically Signed   By: Ulyses Southward M.D.   On: 02/20/2013 00:45    EKG Interpretation   None       MDM   1. Ankle sprain, right, sequela    Patient has been placed in a ASO, and given rehabilitation exercises.  She is to followup with  her primary care physician as needed     Arman Filter, NP 02/20/13 0121

## 2013-03-27 ENCOUNTER — Encounter: Payer: Self-pay | Admitting: Neurology

## 2013-03-31 ENCOUNTER — Ambulatory Visit (INDEPENDENT_AMBULATORY_CARE_PROVIDER_SITE_OTHER): Payer: Medicaid Other | Admitting: Neurology

## 2013-03-31 ENCOUNTER — Encounter (INDEPENDENT_AMBULATORY_CARE_PROVIDER_SITE_OTHER): Payer: Self-pay

## 2013-03-31 ENCOUNTER — Encounter: Payer: Self-pay | Admitting: Neurology

## 2013-03-31 VITALS — BP 131/66 | HR 89 | Ht 62.2 in | Wt 134.0 lb

## 2013-03-31 DIAGNOSIS — G43009 Migraine without aura, not intractable, without status migrainosus: Secondary | ICD-10-CM

## 2013-03-31 DIAGNOSIS — Q8501 Neurofibromatosis, type 1: Secondary | ICD-10-CM | POA: Insufficient documentation

## 2013-03-31 HISTORY — DX: Neurofibromatosis, type 1: Q85.01

## 2013-03-31 HISTORY — DX: Migraine without aura, not intractable, without status migrainosus: G43.009

## 2013-03-31 NOTE — Progress Notes (Signed)
Reason for visit: Neurofibromatosis  Melissa Mathews is a 33 y.o. female  History of present illness:  Melissa Mathews is a 33 year old right-handed black female with a history of neurofibromatosis type I. The patient has very prominent family history of this disease that is found in the maternal great-grandfather, maternal  grandmother, mother, and in one brother. The patient has some neurofibroma on the arms and legs. The patient has one area below the left knee that is tender to touch, but otherwise this does not cause her any discomfort. The patient has no weakness of the extremities related to the neurofibroma. The patient indicates that she has not had any recent head scan evaluations, and she does have a history of migraine headache. The patient may get migraine 2 or 3 times a week, but since her recent delivery of a child in May of 2014, the patient indicates that she has had only one headache. The patient denies any balance issues, weakness of the extremities, or numbness of the extremities. The patient denies problems controlling the bowels or the bladder. The patient will occasionally have some dizziness. The patient denies any balance issues or falls. The patient comes to this office for further evaluation.  Past Medical History  Diagnosis Date  . Medical history non-contributory   . Migraine   . 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 Migraine without aura, without mention of intractable migraine without mention of status migrainosus 03/31/2013    Past Surgical History  Procedure Laterality Date  . Dilation and curettage of uterus    . Tonsillectomy    . Dilation and curettage of uterus N/A 07/25/2012    Procedure: DILATATION AND CURETTAGE;  Surgeon: Turner Danielsavid C Lowe, MD;  Location: [facility];  Service: Gynecology;  Laterality: N/A;  . Nasal fracture surgery      Family History  Problem Relation Age of Onset  . Hyperlipidemia Mother   . Migraines Mother   . Thyroid  disease Mother   . Neurofibromatosis Mother   . Hyperlipidemia Father   . Thyroid disease Maternal Uncle   . Cancer Maternal Grandmother   . Thyroid disease Maternal Grandmother   . Neurofibromatosis Maternal Grandmother   . Neurofibromatosis Brother   . Neurofibromatosis Other     Social history:  reports that she has never smoked. She has never used smokeless tobacco. She reports that she drinks alcohol. She reports that she does not use illicit drugs.  Medications:  Current Outpatient Prescriptions on File Prior to Visit  Medication Sig Dispense Refill  . acetaminophen (TYLENOL) 500 MG tablet Take 1,000 mg by mouth every 6 (six) hours as needed. For pain       No current facility-administered medications on file prior to visit.     No Known Allergies  ROS:  Out of a complete 14 system review of symptoms, the patient complains only of the following symptoms, and all other reviewed systems are negative.  Fatigue Cough Feeling hot Cramps, achy muscles Allergies Headache Not enough sleep, decreased energy, change in appetite  Blood pressure 131/66, pulse 89, height 5' 2.2" (1.58 m), weight 134 lb (60.782 kg).  Physical Exam  General: The patient is alert and cooperative at the time of the examination.  Eyes: Pupils are equal, round, and reactive to light. Discs are flat bilaterally.  Neck: The neck is supple, no carotid bruits are noted.  Respiratory: The respiratory examination is clear.  Cardiovascular: The cardiovascular examination reveals a regular rate and rhythm, no obvious  murmurs or rubs are noted.  Skin: Extremities are without significant edema.  Neurologic Exam  Mental status: The patient is alert and oriented x 3 at the time of the examination. The patient has apparent normal recent and remote memory, with an apparently normal attention span and concentration ability.  Cranial nerves: Facial symmetry is present. There is good sensation of the face to  pinprick and soft touch bilaterally. The strength of the facial muscles and the muscles to head turning and shoulder shrug are normal bilaterally. Speech is well enunciated, no aphasia or dysarthria is noted. Extraocular movements are full. Visual fields are full. The tongue is midline, and the patient has symmetric elevation of the soft palate. No obvious hearing deficits are noted.  Motor: The motor testing reveals 5 over 5 strength of all 4 extremities. Good symmetric motor tone is noted throughout.  Sensory: Sensory testing is intact to pinprick, soft touch, vibration sensation, and position sense on all 4 extremities. No evidence of extinction is noted.  Coordination: Cerebellar testing reveals good finger-nose-finger and heel-to-shin bilaterally.  Gait and station: Gait is normal. Tandem gait is normal. Romberg is negative. No drift is seen.  Reflexes: Deep tendon reflexes are symmetric and normal bilaterally. Toes are downgoing bilaterally.   Assessment/Plan:  1. Neurofibromatosis type I  2. Migraine headache  The patient overall is doing relatively well. The patient has a small neurofibroma below the left knee that is tender to touch. I have recommended that she not consider surgical resection of this at this time. If the issue worsens over time, and severe pain is noted or weakness of the extremity is noted, surgery may be indicated. The patient will followup through this office if needed. We will get a baseline MRI of the brain to rule out meningioma that can be associated with neurofibromatosis.  Marlan Palau MD 03/31/2013 7:37 PM  [facility] [address] [place], [place] [address]  Phone [phone] Fax [phone]

## 2013-04-15 ENCOUNTER — Ambulatory Visit
Admission: RE | Admit: 2013-04-15 | Discharge: 2013-04-15 | Disposition: A | Discharge status: Home / Self Care | Payer: Medicaid Other | Source: Ambulatory Visit | Attending: Neurology | Admitting: Neurology

## 2013-04-15 DIAGNOSIS — G43009 Migraine without aura, not intractable, without status migrainosus: Secondary | ICD-10-CM

## 2013-04-15 DIAGNOSIS — Q8501 Neurofibromatosis, type 1: Secondary | ICD-10-CM

## 2013-04-15 MED ORDER — GADOBENATE DIMEGLUMINE 529 MG/ML IV SOLN
12.0000 mL | Freq: Once | INTRAVENOUS | Status: AC | PRN
  Administered 2013-04-15: 12 mL via INTRAVENOUS

## 2013-04-17 ENCOUNTER — Telehealth: Payer: Self-pay | Admitting: Neurology

## 2013-04-17 NOTE — Telephone Encounter (Signed)
I called patient. MRI the brain was relatively unremarkable with exception that there are several small white matter lesions. According to the reading, this may be associated with her neurofibromatosis. The patient also has a history of migraine headache, and this may lead to white matter lesions.    MRI brain February 12th 2015:  Impression   Mildly abnormal MRI brain (with and without) demonstrating: 1. Few periventricular and subcortical hazy foci of non-specific T2  hyperintensities. These findings are non-specific and considerations  include autoimmune, inflammatory, post-infectious, microvascular ischemic  or migraine associated etiologies. Could also represent focal areas of  signal intensity (FASI) which are seen in association with  neurofibromatosis type 1. 2. No abnormal enhancing lesions.

## 2013-05-03 ENCOUNTER — Encounter (HOSPITAL_COMMUNITY): Payer: Self-pay | Admitting: Emergency Medicine

## 2013-05-03 ENCOUNTER — Emergency Department (HOSPITAL_COMMUNITY)
Admission: EM | Admit: 2013-05-03 | Discharge: 2013-05-03 | Disposition: A | Discharge status: Home / Self Care | Payer: Medicaid Other | Source: Home / Self Care | Attending: Emergency Medicine | Admitting: Emergency Medicine

## 2013-05-03 DIAGNOSIS — S335XXA Sprain of ligaments of lumbar spine, initial encounter: Secondary | ICD-10-CM

## 2013-05-03 DIAGNOSIS — S39012A Strain of muscle, fascia and tendon of lower back, initial encounter: Secondary | ICD-10-CM

## 2013-05-03 MED ORDER — HYDROCODONE-ACETAMINOPHEN 5-325 MG PO TABS: ORAL_TABLET | ORAL | Status: DC

## 2013-05-03 MED ORDER — MELOXICAM 7.5 MG PO TABS: 7.5000 mg | ORAL_TABLET | Freq: Every day | ORAL | Status: DC

## 2013-05-03 MED ORDER — CYCLOBENZAPRINE HCL 5 MG PO TABS: 5.0000 mg | ORAL_TABLET | Freq: Three times a day (TID) | ORAL | Status: DC | PRN

## 2013-05-03 NOTE — Discharge Instructions (Signed)
Do exercises twice daily followed by moist heat for 15 minutes. ° ° ° ° ° °Try to be as active as possible. ° °If no better in 2 weeks, follow up with orthopedist. ° ° °

## 2013-05-03 NOTE — ED Provider Notes (Signed)
  Chief Complaint   Chief Complaint  Patient presents with  . Back Pain    History of Present Illness   Greta DoomQiana Lamier Claros is a 33 year old female who injured her back last November. It had almost completely healed up. Then 2 days ago she lifted a crib and the pain recurred. It's localized in the left lower back area without radiation to the leg. There is no numbness, tingling, or muscle weakness. No bladder or bowel dysfunction, no saddle anesthesia. She denies fever, chills, or intent weight loss. It hurts to bend.  Review of Systems   Other than as noted above, the patient denies any of the following symptoms: Systemic:  No fever, chills, or unexplained weight loss. GI:  No abdominal painor incontinence of bowel. GU:  No dysuria, frequency, urgency, or hematuria. No incontinence of urine or urinary retention.  M-S:  No neck pain or arthritis. Neuro:  No paresthesias, headache, saddle anesthesia, muscular weakness, or progressive neurological deficit.  PMFSH   Past medical history, family history, social history, meds, and allergies were reviewed. Specifically, there is no history of cancer, major trauma, osteoporosis, immunosuppression, or HIV infection.   Physical Examination    Vital signs:  BP 118/56  Pulse 72  Temp(Src) 98.2 F (36.8 C) (Oral)  Resp 16  SpO2 98% General:  Alert, oriented, in no distress. Abdomen:  Soft, non-tender.  No organomegaly or mass.  No pulsatile midline abdominal mass or bruit. Back:   She has tenderness to palpation in the left lower lumbar spine, just above the iliac crest. Her back has 85 of flexion, 20 of backward bending, 20 of lateral bending, and 90 of rotation with minimal pain. Straight leg raising is negative. Neuro:  Normal muscle strength, sensations and DTRs. Extremities: Pedal pulses were full, there was no edema. Skin:  Clear, warm and dry.  No rash.   Assessment   The encounter diagnosis was Lumbar strain.  Plan     1.   Meds:  The following meds were prescribed:   Discharge Medication List as of 05/03/2013 10:24 AM    START taking these medications   Details  cyclobenzaprine (FLEXERIL) 5 MG tablet Take 1 tablet (5 mg total) by mouth 3 (three) times daily as needed for muscle spasms., Starting 05/03/2013, Until Discontinued, Normal    HYDROcodone-acetaminophen (NORCO/VICODIN) 5-325 MG per tablet 1 to 2 tabs every 4 to 6 hours as needed for pain., Print    meloxicam (MOBIC) 7.5 MG tablet Take 1 tablet (7.5 mg total) by mouth daily., Starting 05/03/2013, Until Discontinued, Normal        2.  Patient Education/Counseling:  The patient was given appropriate handouts, self care instructions, and instructed in symptomatic relief. The patient was encouraged to try to be as active as possible and given some exercises to do followed by moist heat.  3.  Follow up:  The patient was told to follow up here if no better in 3 to 4 days, or sooner if becoming worse in any way, and given some red flag symptoms such as worsening pain or new neurological symptoms which would prompt immediate return.  Follow up with Dr. Marcene CorningPeter Dalldorf if no better in 2-4 weeks.     Reuben Likesavid C Asiyah Pineau, MD 05/03/13 1134

## 2013-05-03 NOTE — ED Notes (Signed)
Pt. Stated, i was taking down a crib moving and i hurt my mid back.

## 2013-10-24 ENCOUNTER — Emergency Department (HOSPITAL_COMMUNITY)
Admission: EM | Admit: 2013-10-24 | Discharge: 2013-10-24 | Disposition: A | Discharge status: Home / Self Care | Payer: Medicaid Other | Attending: Emergency Medicine | Admitting: Emergency Medicine

## 2013-10-24 ENCOUNTER — Encounter (HOSPITAL_COMMUNITY): Payer: Self-pay | Admitting: Emergency Medicine

## 2013-10-24 DIAGNOSIS — G43909 Migraine, unspecified, not intractable, without status migrainosus: Secondary | ICD-10-CM | POA: Diagnosis not present

## 2013-10-24 DIAGNOSIS — M436 Torticollis: Secondary | ICD-10-CM | POA: Diagnosis present

## 2013-10-24 DIAGNOSIS — Z791 Long term (current) use of non-steroidal anti-inflammatories (NSAID): Secondary | ICD-10-CM | POA: Insufficient documentation

## 2013-10-24 DIAGNOSIS — M62838 Other muscle spasm: Secondary | ICD-10-CM | POA: Insufficient documentation

## 2013-10-24 MED ORDER — CYCLOBENZAPRINE HCL 10 MG PO TABS: 10.0000 mg | ORAL_TABLET | Freq: Two times a day (BID) | ORAL | Status: DC | PRN

## 2013-10-24 MED ORDER — NAPROXEN 500 MG PO TABS: 500.0000 mg | ORAL_TABLET | Freq: Two times a day (BID) | ORAL | Status: DC

## 2013-10-24 MED ORDER — ACETAMINOPHEN 500 MG PO TABS: 1000.0000 mg | ORAL_TABLET | Freq: Once | ORAL | Status: DC

## 2013-10-24 MED ORDER — HYDROCODONE-ACETAMINOPHEN 5-325 MG PO TABS: 2.0000 | ORAL_TABLET | ORAL | Status: DC | PRN

## 2013-10-24 NOTE — ED Provider Notes (Signed)
CSN: [id number]     Arrival date & time 10/24/13  4098 History   None    Chief Complaint  Patient presents with  . Torticollis     (Consider location/radiation/quality/duration/timing/severity/associated sxs/prior Treatment) HPI  This is a 33 year old female who presents emergency Department with chief complaint of neck pain. 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 Her return included heavy lifting and long air flight. She complains of pain which developed in the right side of her neck after her bone plate. She has difficulty turning the neck. She states that it comes and goes. This morning she woke with severe spasm in the right side after stretching her neck. She denies headaches, fever, photophobia or other signs of meningitis.  She's taken no medications for the pain. Past Medical History  Diagnosis Date  . Medical history non-contributory   . Migraine   . 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 Migraine without aura, without mention of intractable migraine without mention of status migrainosus 03/31/2013   Past Surgical History  Procedure Laterality Date  . Dilation and curettage of uterus    . Tonsillectomy    . Dilation and curettage of uterus N/A 07/25/2012    Procedure: DILATATION AND CURETTAGE;  Surgeon: Turner Daniels, MD;  Location: [facility];  Service: Gynecology;  Laterality: N/A;  . Nasal fracture surgery     Family History  Problem Relation Age of Onset  . Hyperlipidemia Mother   . Migraines Mother   . Thyroid disease Mother   . Neurofibromatosis Mother   . Hyperlipidemia Father   . Thyroid disease Maternal Uncle   . Cancer Maternal Grandmother   . Thyroid disease Maternal Grandmother   . Neurofibromatosis Maternal Grandmother   . Neurofibromatosis Brother   . Neurofibromatosis Other    History  Substance Use Topics  . Smoking status: Never Smoker   . Smokeless tobacco: Never Used  . Alcohol Use: Yes   Comment: 1-2 per week   OB History   Grav Para Term Preterm Abortions TAB SAB Ect Mult Living   6 3 3  3  3   3      Review of Systems  Ten systems reviewed and are negative for acute change, except as noted in the HPI.    Allergies  Review of patient's allergies indicates no known allergies.  Home Medications   Prior to Admission medications   Medication Sig Start Date End Date Taking? Authorizing Provider  acetaminophen (TYLENOL) 500 MG tablet Take 1,000 mg by mouth every 6 (six) hours as needed. For pain    Historical Provider, MD  cyclobenzaprine (FLEXERIL) 5 MG tablet Take 1 tablet (5 mg total) by mouth 3 (three) times daily as needed for muscle spasms. 05/03/13   Reuben Likes, MD  HYDROcodone-acetaminophen (NORCO/VICODIN) 5-325 MG per tablet 1 to 2 tabs every 4 to 6 hours as needed for pain. 05/03/13   Reuben Likes, MD  meloxicam (MOBIC) 7.5 MG tablet Take 1 tablet (7.5 mg total) by mouth daily. 05/03/13   Reuben Likes, MD  naproxen (NAPROSYN) 250 MG tablet Take 250 mg by mouth 2 (two) times daily with a meal.    Historical Provider, MD   BP 131/78  Pulse 76  Temp(Src) 98.1 F (36.7 C) (Oral)  Resp 20  SpO2 98% Physical Exam Physical Exam  Nursing note and vitals reviewed. Constitutional: She is oriented to person, place, and time. She appears well-developed and well-nourished. No  distress.  HENT:  Head: Normocephalic and atraumatic.  Eyes: Conjunctivae normal and EOM are normal. Pupils are equal, round, and reactive to light. No scleral icterus.  Neck: Normal range of motion.  Cardiovascular: Normal rate, regular rhythm and normal heart sounds.  Exam reveals no gallop and no friction rub.   No murmur heard. Pulmonary/Chest: Effort normal and breath sounds normal. No respiratory distress.  Abdominal: Soft. Bowel sounds are normal. She exhibits no distension and no mass. There is no tenderness. There is no guarding.  Neurological: She is alert and oriented to person,  place, and time.  MSK: Patient has right sided spasm, tenderness in the suboccipital region and be between the right scapula medial border and the thoracic vertebrae. Patient has limited range of motion of right lateral rotation. Range of motion is better with lateral rotation. She has difficulty with flexion and extension. Patient compensates with torso movement. No nuchal rigidity. Skin: Skin is warm and dry. She is not diaphoretic.    ED Course  Procedures (including critical care time) Labs Review Labs Reviewed - No data to display  Imaging Review No results found.   EKG Interpretation None      MDM   Final diagnoses:  Neck muscle spasm    9:10 AM Patient with spasm of neck. No fever, headache or nuchal rigidity. Consistent with spasm of the splenius cervicis muscle. Discharge with Norco, Flexeril, naproxen and referral to physical therapy and massage therapy.    Arthor CaptainAbigail Almadelia Looman, PA-C 10/24/13 [phone]

## 2013-10-24 NOTE — Discharge Instructions (Signed)
Heat Therapy °Heat therapy can help ease sore, stiff, injured, and tight muscles and joints. Heat relaxes your muscles, which may help ease your pain.  °RISKS AND COMPLICATIONS °If you have any of the following conditions, do not use heat therapy unless your health care provider has approved: °· Poor circulation. °· Healing wounds or scarred skin in the area being treated. °· Diabetes, heart disease, or high blood pressure. °· Not being able to feel (numbness) the area being treated. °· Unusual swelling of the area being treated. °· Active infections. °· Blood clots. °· Cancer. °· Inability to communicate pain. This may include young children and people who have problems with their brain function (dementia). °· Pregnancy. °Heat therapy should only be used on old, pre-existing, or long-lasting (chronic) injuries. Do not use heat therapy on new injuries unless directed by your health care provider. °HOW TO USE HEAT THERAPY °There are several different kinds of heat therapy, including: °· Moist heat pack. °· Warm water bath. °· Hot water bottle. °· Electric heating pad. °· Heated gel pack. °· Heated wrap. °· Electric heating pad. °Use the heat therapy method suggested by your health care provider. Follow your health care provider's instructions on when and how to use heat therapy. °GENERAL HEAT THERAPY RECOMMENDATIONS °· Do not sleep while using heat therapy. Only use heat therapy while you are awake. °· Your skin may turn pink while using heat therapy. Do not use heat therapy if your skin turns red. °· Do not use heat therapy if you have new pain. °· High heat or long exposure to heat can cause burns. Be careful when using heat therapy to avoid burning your skin. °· Do not use heat therapy on areas of your skin that are already irritated, such as with a rash or sunburn. °SEEK MEDICAL CARE IF: °· You have blisters, redness, swelling, or numbness. °· You have new pain. °· Your pain is worse. °MAKE SURE  YOU: °· Understand these instructions. °· Will watch your condition. °· Will get help right away if you are not doing well or get worse. °Document Released: 05/15/2011 Document Revised: 07/07/2013 Document Reviewed: 04/15/2013 °ExitCare® Patient Information ©2015 [employer]. This information is not intended to replace advice given to you by your health care provider. Make sure you discuss any questions you have with your health care provider. °Muscle Cramps and Spasms °Muscle cramps and spasms occur when a muscle or muscles tighten and you have no control over this tightening (involuntary muscle contraction). They are a common problem and can develop in any muscle. The most common place is in the calf muscles of the leg. Both muscle cramps and muscle spasms are involuntary muscle contractions, but they also have differences:  °· Muscle cramps are sporadic and painful. They may last a few seconds to a quarter of an hour. Muscle cramps are often more forceful and last longer than muscle spasms. °· Muscle spasms may or may not be painful. They may also last just a few seconds or much longer. °CAUSES  °It is uncommon for cramps or spasms to be due to a serious underlying problem. In many cases, the cause of cramps or spasms is unknown. Some common causes are:  °· Overexertion.   °· Overuse from repetitive motions (doing the same thing over and over).   °· Remaining in a certain position for a long period of time.   °· Improper preparation, form, or technique while performing a sport or activity.   °· Dehydration.   °· Injury.   °·   Side effects of some medicines.   °· Abnormally low levels of the salts and ions in your blood (electrolytes), especially potassium and calcium. This could happen if you are taking water pills (diuretics) or you are pregnant.   °Some underlying medical problems can make it more likely to develop cramps or spasms. These include, but are not limited to:  °· Diabetes.   °· Parkinson disease.    °· Hormone disorders, such as thyroid problems.   °· Alcohol abuse.   °· Diseases specific to muscles, joints, and bones.   °· Blood vessel disease where not enough blood is getting to the muscles.   °HOME CARE INSTRUCTIONS  °· Stay well hydrated. Drink enough water and fluids to keep your urine clear or pale yellow. °· It may be helpful to massage, stretch, and relax the affected muscle. °· For tight or tense muscles, use a warm towel, heating pad, or hot shower water directed to the affected area. °· If you are sore or have pain after a cramp or spasm, applying ice to the affected area may relieve discomfort. °· Put ice in a plastic bag. °· Place a towel between your skin and the bag. °· Leave the ice on for 15-20 minutes, 03-04 times a day. °· Medicines used to treat a known cause of cramps or spasms may help reduce their frequency or severity. Only take over-the-counter or prescription medicines as directed by your caregiver. °SEEK MEDICAL CARE IF:  °Your cramps or spasms get more severe, more frequent, or do not improve over time.  °MAKE SURE YOU:  °· Understand these instructions. °· Will watch your condition. °· Will get help right away if you are not doing well or get worse. °Document Released: 08/12/2001 Document Revised: 06/17/2012 Document Reviewed: 02/07/2012 °ExitCare® Patient Information ©2015 [employer]. This information is not intended to replace advice given to you by your health care provider. Make sure you discuss any questions you have with your health care provider. ° °

## 2013-10-24 NOTE — ED Notes (Signed)
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

## 2013-10-25 NOTE — ED Provider Notes (Signed)
Medical screening examination/treatment/procedure(s) were performed by non-physician practitioner and as supervising physician I was immediately available for consultation/collaboration.   EKG Interpretation None        Samuel JesterKathleen Otisha Spickler, DO 10/25/13 1625

## 2013-10-27 ENCOUNTER — Emergency Department (HOSPITAL_COMMUNITY)
Admission: EM | Admit: 2013-10-27 | Discharge: 2013-10-28 | Disposition: A | Discharge status: Home / Self Care | Payer: Medicaid Other | Attending: Emergency Medicine | Admitting: Emergency Medicine

## 2013-10-27 ENCOUNTER — Encounter (HOSPITAL_COMMUNITY): Payer: Self-pay | Admitting: Emergency Medicine

## 2013-10-27 DIAGNOSIS — M542 Cervicalgia: Secondary | ICD-10-CM

## 2013-10-27 DIAGNOSIS — Z7982 Long term (current) use of aspirin: Secondary | ICD-10-CM | POA: Diagnosis not present

## 2013-10-27 DIAGNOSIS — R509 Fever, unspecified: Secondary | ICD-10-CM | POA: Diagnosis not present

## 2013-10-27 DIAGNOSIS — M436 Torticollis: Secondary | ICD-10-CM | POA: Diagnosis not present

## 2013-10-27 DIAGNOSIS — G43909 Migraine, unspecified, not intractable, without status migrainosus: Secondary | ICD-10-CM | POA: Insufficient documentation

## 2013-10-27 DIAGNOSIS — Z79899 Other long term (current) drug therapy: Secondary | ICD-10-CM | POA: Diagnosis not present

## 2013-10-27 LAB — CBC WITH DIFFERENTIAL/PLATELET
Basophils Absolute: 0 10*3/uL (ref 0.0–0.1)
Basophils Relative: 0 % (ref 0–1)
EOS PCT: 2 % (ref 0–5)
Eosinophils Absolute: 0.2 10*3/uL (ref 0.0–0.7)
HEMATOCRIT: 36.7 % (ref 36.0–46.0)
Hemoglobin: 12.4 g/dL (ref 12.0–15.0)
LYMPHS ABS: 1.3 10*3/uL (ref 0.7–4.0)
Lymphocytes Relative: 18 % (ref 12–46)
MCH: 28.9 pg (ref 26.0–34.0)
MCHC: 33.8 g/dL (ref 30.0–36.0)
MCV: 85.5 fL (ref 78.0–100.0)
MONO ABS: 0.8 10*3/uL (ref 0.1–1.0)
Monocytes Relative: 11 % (ref 3–12)
Neutro Abs: 4.9 10*3/uL (ref 1.7–7.7)
Neutrophils Relative %: 69 % (ref 43–77)
Platelets: 234 10*3/uL (ref 150–400)
RBC: 4.29 MIL/uL (ref 3.87–5.11)
RDW: 15.1 % (ref 11.5–15.5)
WBC: 7.2 10*3/uL (ref 4.0–10.5)

## 2013-10-27 LAB — COMPREHENSIVE METABOLIC PANEL
ALT: 16 U/L (ref 0–35)
AST: 17 U/L (ref 0–37)
Albumin: 4.2 g/dL (ref 3.5–5.2)
Alkaline Phosphatase: 59 U/L (ref 39–117)
Anion gap: 11 (ref 5–15)
BUN: 11 mg/dL (ref 6–23)
CALCIUM: 9.3 mg/dL (ref 8.4–10.5)
CO2: 27 meq/L (ref 19–32)
CREATININE: 0.6 mg/dL (ref 0.50–1.10)
Chloride: 103 mEq/L (ref 96–112)
GFR calc Af Amer: >90 mL/min (ref >90)
Glucose, Bld: 88 mg/dL (ref 70–99)
Potassium: 4 mEq/L (ref 3.7–5.3)
Sodium: 141 mEq/L (ref 137–147)
Total Bilirubin: 0.2 mg/dL — ABNORMAL LOW (ref 0.3–1.2)
Total Protein: 7.1 g/dL (ref 6.0–8.3)

## 2013-10-27 MED ORDER — KETOROLAC TROMETHAMINE 30 MG/ML IJ SOLN
30.0000 mg | Freq: Once | INTRAMUSCULAR | Status: AC
  Administered 2013-10-27: 30 mg via INTRAVENOUS
  Filled 2013-10-27: qty 1

## 2013-10-27 MED ORDER — SODIUM CHLORIDE 0.9 % IV BOLUS (SEPSIS)
1000.0000 mL | Freq: Once | INTRAVENOUS | Status: AC
  Administered 2013-10-28: 1000 mL via INTRAVENOUS

## 2013-10-27 NOTE — ED Provider Notes (Signed)
CSN: [id number]     Arrival date & time 10/27/13  2020 History   First MD Initiated Contact with Patient 10/27/13 2255     Chief Complaint  Patient presents with  . Neck Pain  . Torticollis  . Fever     (Consider location/radiation/quality/duration/timing/severity/associated sxs/prior Treatment) HPI Melissa Mathews is a 33 year old female with a past medical history of NF-1 coming in with neck stiffness for the past 2 weeks. Patient states she is in [employer] and recently returned from [place] at the beginning of this month. Patient denies any sick contacts at that time, or others on her trip being sick after she spoke with them. Her neck pain is midline, described as sharp and throbbing, and worse with moving. Patient states it's better when she sits still. She has had this in the past, and has had problems with chronic neck pain. She states this feels similar however it lasted longer than one day. She denies any fevers headache or neurological symptoms, including numbness tingling or weakness of any extremity. Patient was recently seen in this emergency department and discharged with pain medicine as well as muscle relaxants. She states it is not helping her. She denies any chest pain shortness of breath abdominal pain nausea vomiting diarrhea or changes in her urine.  Past Medical History  Diagnosis Date  . Medical history non-contributory   . Migraine   . 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 Migraine without aura, without mention of intractable migraine without mention of status migrainosus 03/31/2013   Past Surgical History  Procedure Laterality Date  . Dilation and curettage of uterus    . Tonsillectomy    . Dilation and curettage of uterus N/A 07/25/2012    Procedure: DILATATION AND CURETTAGE;  Surgeon: Turner Daniels, MD;  Location: [facility];  Service: Gynecology;  Laterality: N/A;  . Nasal fracture surgery     Family History  Problem Relation Age of Onset  . Hyperlipidemia  Mother   . Migraines Mother   . Thyroid disease Mother   . Neurofibromatosis Mother   . Hyperlipidemia Father   . Thyroid disease Maternal Uncle   . Cancer Maternal Grandmother   . Thyroid disease Maternal Grandmother   . Neurofibromatosis Maternal Grandmother   . Neurofibromatosis Brother   . Neurofibromatosis Other    History  Substance Use Topics  . Smoking status: Never Smoker   . Smokeless tobacco: Never Used  . Alcohol Use: Yes     Comment: 1-2 per week   OB History   Grav Para Term Preterm Abortions TAB SAB Ect Mult Living   Review of Systems 10 Systems reviewed and are negative for acute change except as noted in the HPI.    Allergies  Review of patient's allergies indicates no known allergies.  Home Medications   Prior to Admission medications   Medication Sig Start Date End Date Taking? Authorizing Provider  acetaminophen (TYLENOL) 500 MG tablet Take 1,000 mg by mouth every 6 (six) hours as needed. For pain    Historical Provider, MD  aspirin-acetaminophen-caffeine (EXCEDRIN EXTRA STRENGTH) [phone] MG per tablet Take 1 tablet by mouth every 6 (six) hours as needed for headache (pain).    Historical Provider, MD  cyclobenzaprine (FLEXERIL) 10 MG tablet Take 1 tablet (10 mg total) by mouth 2 (two) times daily as needed for muscle spasms. 10/24/13   Arthor Captain, PA-C  HYDROcodone-acetaminophen [facility]) [phone]  MG per tablet Take 2 tablets by mouth every 4 (four) hours as needed. 10/24/13   Arthor Captain, PA-C  levonorgestrel (MIRENA) 20 MCG/24HR IUD 1 each by Intrauterine route once.    Historical Provider, MD  naproxen (NAPROSYN) 500 MG tablet Take 1 tablet (500 mg total) by mouth 2 (two) times daily with a meal. 10/24/13   Arthor Captain, PA-C  Prenatal Vit-Fe Fumarate-FA (PRENATAL MULTIVITAMIN) TABS tablet Take 1 tablet by mouth every other day.    Historical Provider, MD   BP 125/84  Pulse 72  Temp(Src) 98.4 F (36.9 C) (Oral)  Resp 16   SpO2 100% Physical Exam  Nursing note and vitals reviewed. Constitutional: She is oriented to person, place, and time. She appears well-developed and well-nourished. No distress.  HENT:  Head: Normocephalic and atraumatic.  Nose: Nose normal.  Mouth/Throat: Oropharynx is clear and moist. No oropharyngeal exudate.  Eyes: Conjunctivae and EOM are normal. Pupils are equal, round, and reactive to light. No scleral icterus.  Neck: No JVD present. No tracheal deviation present. No thyromegaly present.  Limited range of motion noted.  Cardiovascular: Normal rate, regular rhythm and normal heart sounds.  Exam reveals no gallop and no friction rub.   No murmur heard. Pulmonary/Chest: Effort normal and breath sounds normal. No respiratory distress. She has no wheezes. She exhibits no tenderness.  Abdominal: Soft. Bowel sounds are normal. She exhibits no distension and no mass. There is no tenderness. There is no rebound and no guarding.  Musculoskeletal: Normal range of motion. She exhibits no edema and no tenderness.  Lymphadenopathy:    She has no cervical adenopathy.  Neurological: She is alert and oriented to person, place, and time. No cranial nerve deficit. She exhibits normal muscle tone. Coordination normal.  Skin: Skin is warm and dry. No rash noted. She is not diaphoretic. No erythema. No pallor.    ED Course  Procedures (including critical care time) Labs Review Labs Reviewed  COMPREHENSIVE METABOLIC PANEL - Abnormal; Notable for the following:    Total Bilirubin 0.2 (*)    All other components within normal limits  CBC WITH DIFFERENTIAL  HCG, SERUM, QUALITATIVE    Imaging Review Ct Head Wo Contrast  10/28/2013   CLINICAL DATA:  Neck pain and stiffness. Recent overseas travel. Recent fever. No fever currently.  EXAM: CT HEAD WITHOUT CONTRAST  CT NECK WITH CONTRAST  TECHNIQUE: Contiguous axial images were obtained from the base of the skull through the vertex without intravenous  contrast  Multidetector CT imaging of the and neck was performed using the standard protocol following the bolus administration of intravenous contrast.  CONTRAST:  75mL OMNIPAQUE IOHEXOL 300 MG/ML  SOLN  COMPARISON:  Brain MR dated 04/15/2013. There is no report available for that examination.  FINDINGS: CT HEAD FINDINGS  Normal appearing cerebral hemispheres and posterior fossa structures. Normal size and position of the ventricles. No intracranial hemorrhage, mass lesion or CT evidence of acute infarction. Unremarkable bones and included paranasal sinuses.  CT NECK FINDINGS  Mild bullous changes at both lung apices. Oval soft tissue mass involving the skin and underlying subcutaneous fat in the posterior neck on the left. This measures 2.7 x 1.3 cm in maximum dimensions on image number 18 and 2.8 cm in length on coronal image number 85. No other masses are seen and no enlarged lymph nodes are demonstrated. The regional bones are unremarkable.  IMPRESSION: 1. Normal head CT. 2. 2.8 x 2.7 x 1.3 cm superficial mass in the posterior  aspect of the neck on the left. This could represent a large sebaceous cyst or a neoplastic mass. Correlation with the physical examination findings is necessary. 3. Mild changes of COPD.   Electronically Signed   By: Gordan Payment M.D.   On: 10/28/2013 00:56   Ct Soft Tissue Neck W Contrast  10/28/2013   CLINICAL DATA:  Neck pain and stiffness. Recent overseas travel. Recent fever. No fever currently.  EXAM: CT HEAD WITHOUT CONTRAST  CT NECK WITH CONTRAST  TECHNIQUE: Contiguous axial images were obtained from the base of the skull through the vertex without intravenous contrast  Multidetector CT imaging of the and neck was performed using the standard protocol following the bolus administration of intravenous contrast.  CONTRAST:  75mL OMNIPAQUE IOHEXOL 300 MG/ML  SOLN  COMPARISON:  Brain MR dated 04/15/2013. There is no report available for that examination.  FINDINGS: CT HEAD FINDINGS   Normal appearing cerebral hemispheres and posterior fossa structures. Normal size and position of the ventricles. No intracranial hemorrhage, mass lesion or CT evidence of acute infarction. Unremarkable bones and included paranasal sinuses.  CT NECK FINDINGS  Mild bullous changes at both lung apices. Oval soft tissue mass involving the skin and underlying subcutaneous fat in the posterior neck on the left. This measures 2.7 x 1.3 cm in maximum dimensions on image number 18 and 2.8 cm in length on coronal image number 85. No other masses are seen and no enlarged lymph nodes are demonstrated. The regional bones are unremarkable.  IMPRESSION: 1. Normal head CT. 2. 2.8 x 2.7 x 1.3 cm superficial mass in the posterior aspect of the neck on the left. This could represent a large sebaceous cyst or a neoplastic mass. Correlation with the physical examination findings is necessary. 3. Mild changes of COPD.   Electronically Signed   By: Gordan Payment M.D.   On: 10/28/2013 00:56     EKG Interpretation None      MDM   Final diagnoses:  None    Patient has a history of NF1 and coming in today with neck stiffness. Although she has no headache or fever here, she does admit to having a subjective fever at one point over the past 7 days.  The patient is a high-risk as she just returned from [place], and she is not a frequent utilizing the emergency department as bounced back. Will perform basic labs CT scan and possible LP for evaluation. My concern is for meningitis.  This could likely also be musculoskeletal strain, or exacerbation of her torticollis.   CT findings as above. Patient has left posterior neck mass concerning for sebaceous cysts versus new tumor. In the setting of her and NF1, patient was advised to followup with her neurologist within the next 2 days for evaluation.  Signs remained stable, patient appears comfortable in bed in no acute distress. Patient safe for discharge.  Tomasita Crumble, MD 10/28/13  [phone]

## 2013-10-27 NOTE — ED Notes (Signed)
Patient here with complaint of neck pain. States that she recently returned from [place] with [employer] unit 7 days ago. 2 days after returning, 5 Days ago patient started feeling neck stiffness and pain. 3 days ago patient stated that she had a fever but it wasn't measured. States that the fever only lasted 1 day and resolved without intervention. Currently doesn't have a fever.

## 2013-10-27 NOTE — ED Notes (Signed)
States that other members of her unit which accompanied her are not reporting any similar symptoms. Denies injury.

## 2013-10-28 ENCOUNTER — Emergency Department (HOSPITAL_COMMUNITY): Payer: Medicaid Other

## 2013-10-28 LAB — HCG, SERUM, QUALITATIVE: PREG SERUM: NEGATIVE

## 2013-10-28 MED ORDER — IOHEXOL 300 MG/ML  SOLN
75.0000 mL | Freq: Once | INTRAMUSCULAR | Status: AC | PRN
  Administered 2013-10-28: 75 mL via INTRAVENOUS

## 2013-10-28 NOTE — Discharge Instructions (Signed)
Melissa Mathews, you were seen today for neck pain.  You CT scan results are below.  This may indicate a tumor in your neck causing you pain.  Please follow up with your primary doctor and your neurologist in the next 2 days for continued care.  Continue to take your pain medication at home as needed.  If your symptoms worsen, or you develop any numbness, tingling, or weakness, return to the ED immediately for repeat evaluation.  Thank you.   FINDINGS: CT HEAD FINDINGS  Normal appearing cerebral hemispheres and posterior fossa structures. Normal size and position of the ventricles. No intracranial hemorrhage, mass lesion or CT evidence of acute infarction. Unremarkable bones and included paranasal sinuses.  CT NECK FINDINGS  Mild bullous changes at both lung apices. Oval soft tissue mass involving the skin and underlying subcutaneous fat in the posterior neck on the left. This measures 2.7 x 1.3 cm in maximum dimensions on image number 18 and 2.8 cm in length on coronal image number 85. No other masses are seen and no enlarged lymph nodes are demonstrated. The regional bones are unremarkable.  IMPRESSION: 1. Normal head CT. 2. 2.8 x 2.7 x 1.3 cm superficial mass in the posterior aspect of the neck on the left. This could represent a large sebaceous cyst or a neoplastic mass. Correlation with the physical examination findings is necessary. 3. Mild changes of COPD.

## 2013-11-04 ENCOUNTER — Ambulatory Visit (INDEPENDENT_AMBULATORY_CARE_PROVIDER_SITE_OTHER): Payer: Medicaid Other | Admitting: Neurology

## 2013-11-04 ENCOUNTER — Encounter: Payer: Self-pay | Admitting: Neurology

## 2013-11-04 VITALS — BP 114/74 | HR 86 | Wt 125.0 lb

## 2013-11-04 DIAGNOSIS — G43009 Migraine without aura, not intractable, without status migrainosus: Secondary | ICD-10-CM

## 2013-11-04 DIAGNOSIS — S139XXA Sprain of joints and ligaments of unspecified parts of neck, initial encounter: Secondary | ICD-10-CM | POA: Insufficient documentation

## 2013-11-04 DIAGNOSIS — Q8501 Neurofibromatosis, type 1: Secondary | ICD-10-CM

## 2013-11-04 MED ORDER — NAPROXEN 500 MG PO TABS
500.0000 mg | ORAL_TABLET | Freq: Two times a day (BID) | ORAL | Status: DC
Start: 2013-11-04 — End: 2015-05-05

## 2013-11-04 MED ORDER — CYCLOBENZAPRINE HCL 5 MG PO TABS: 5.0000 mg | ORAL_TABLET | Freq: Three times a day (TID) | ORAL | Status: AC

## 2013-11-04 NOTE — Progress Notes (Signed)
Reason for visit: Cervical strain  Melissa Mathews is a 33 y.o. female  History of present illness:  Melissa Mathews is a 33 year old right-handed black female with a history of neurofibromatosis type I and a history of migraine headaches. The patient traveled from [place] to [place], and then from [place] to the [place] with a total travel time greater than 24 hours. The patient noted onset of left neck discomfort that was present some on the right as well as with stiffness of the neck. The patient denies any pain down the arms. She denies any numbness of the extremities. She does report stiffness of the neck, and a reduction of mobility of the neck. The patient denies any weakness of the extremities, balance problems, or difficulty controlling the bowels or the bladder. She went to the emergency room, and a CT scan was done of the brain and a soft tissue cervical scan was done showing evidence of a small cyst -type lesion of the paraspinal area of the left neck. The patient was placed on Naprosyn and Flexeril, and she has had some improvement with this with reduction of pain on the right side, with still some discomfort at the base of the neck on the left. The mobility has improved some, but has not normalized. She is not sleeping well secondary to pain. She comes to this office for an evaluation.  Past Medical History  Diagnosis Date  . Medical history non-contributory   . Migraine   . 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 Migraine without aura, without mention of intractable migraine without mention of status migrainosus 03/31/2013    Past Surgical History  Procedure Laterality Date  . Dilation and curettage of uterus    . Tonsillectomy    . Dilation and curettage of uterus N/A 07/25/2012    Procedure: DILATATION AND CURETTAGE;  Surgeon: Turner Daniels, MD;  Location: [facility];  Service: Gynecology;  Laterality: N/A;  . Nasal fracture surgery      Family History  Problem  Relation Age of Onset  . Hyperlipidemia Mother   . Migraines Mother   . Thyroid disease Mother   . Neurofibromatosis Mother   . Hyperlipidemia Father   . Thyroid disease Maternal Uncle   . Cancer Maternal Grandmother   . Thyroid disease Maternal Grandmother   . Neurofibromatosis Maternal Grandmother   . Neurofibromatosis Brother   . Neurofibromatosis Other     Social history:  reports that she has never smoked. She has never used smokeless tobacco. She reports that she drinks alcohol. She reports that she does not use illicit drugs.  Medications:  Current Outpatient Prescriptions on File Prior to Visit  Medication Sig Dispense Refill  . acetaminophen (TYLENOL) 500 MG tablet Take 1,000 mg by mouth every 6 (six) hours as needed for mild pain. For pain      . HYDROcodone-acetaminophen (NORCO) 5-325 MG per tablet Take 2 tablets by mouth every 4 (four) hours as needed.  10 tablet  0  . levonorgestrel (MIRENA) 20 MCG/24HR IUD 1 each by Intrauterine route once.       No current facility-administered medications on file prior to visit.     No Known Allergies  ROS:  Out of a complete 14 system review of symptoms, the patient complains only of the following symptoms, and all other reviewed systems are negative.  Decreased appetite Insomnia, frequent waking Headache  Blood pressure 114/74, pulse 86, weight 125 lb (56.7 kg).  Physical Exam  General:  The patient is alert and cooperative at the time of the examination.  Neuromuscular: The patient lacks about 20 of lateral rotation of the cervical spine bilaterally. No masses were felt in the posterior cervical area.  Skin: Extremities are without significant edema.  Neurologic Exam  Mental status: The patient is alert and oriented x 3 at the time of the examination. The patient has apparent normal recent and remote memory, with an apparently normal attention span and concentration ability.  Cranial nerves: Facial symmetry is  present. There is good sensation of the face to pinprick and soft touch bilaterally. The strength of the facial muscles and the muscles to head turning and shoulder shrug are normal bilaterally. Speech is well enunciated, no aphasia or dysarthria is noted. Extraocular movements are full. Visual fields are full. The tongue is midline, and the patient has symmetric elevation of the soft palate. No obvious hearing deficits are noted.  Motor: The motor testing reveals 5 over 5 strength of all 4 extremities. Good symmetric motor tone is noted throughout.  Sensory: Sensory testing is intact to pinprick, soft touch on all 4 extremities.  Coordination: Cerebellar testing reveals good finger-nose-finger and heel-to-shin bilaterally.  Gait and station: Gait is normal. Tandem gait is normal. Romberg is negative. No drift is seen.  Reflexes: Deep tendon reflexes are symmetric and normal bilaterally. Toes are downgoing bilaterally.   CT head and cervical spine 10/28/13:  IMPRESSION:  1. Normal head CT.  2. 2.8 x 2.7 x 1.3 cm superficial mass in the posterior aspect of  the neck on the left. This could represent a large sebaceous cyst or  a neoplastic mass. Correlation with the physical examination  findings is necessary.  3. Mild changes of COPD.   MRI brain 04/17/13:  IMPRESSION:  Mildly abnormal MRI brain (with and without) demonstrating:  1. Few periventricular and subcortical hazy foci of non-specific T2 hyperintensities. These findings are non-specific and considerations include autoimmune, inflammatory, post-infectious, microvascular ischemic or migraine associated etiologies. Could also represent focal areas of signal intensity (FASI) which are seen in association with neurofibromatosis type 1.  2. No abnormal enhancing lesions.     Assessment/Plan:  One. Cervical strain syndrome  2. Neurofibromatosis type I  3. History of migraine headache  The patient does have a small mass in the  posterior cervical area on the left. This will need to be followed, a repeat CT scan of the soft tissues of the neck will be done in about 3-4 months. The patient has a cervical strain syndrome, and she will be set up for neuromuscular therapy, and placed back on Flexeril and Naprosyn. She will followup in 4 months. The CT of the soft tissues of the neck will be ordered at that time.  Marlan Palau MD 11/04/2013 7:53 PM  [facility] [address] [place], [place] [address]  Phone [phone] Fax [phone]

## 2013-11-04 NOTE — Patient Instructions (Signed)

## 2013-11-06 ENCOUNTER — Telehealth: Payer: Self-pay | Admitting: Neurology

## 2013-11-06 MED ORDER — HYDROCODONE-ACETAMINOPHEN 5-325 MG PO TABS: 2.0000 | ORAL_TABLET | Freq: Four times a day (QID) | ORAL | Status: AC | PRN

## 2013-11-06 NOTE — Telephone Encounter (Signed)
Patient requesting refill of Norco, please call and advise.

## 2013-11-06 NOTE — Telephone Encounter (Signed)
Patient is requesting a Rx for Hydrocodone.  It does not appear we have prescribed this before.  Would you like to prescribe?  Please advise.  Thank you.

## 2013-11-06 NOTE — Telephone Encounter (Signed)
I will write a prescription for hydrocodone. 

## 2013-11-06 NOTE — Telephone Encounter (Signed)
Called pt to inform her that her Rx was ready to be picked up at the front desk and if she has any other problems, questions or concerns to call the office. Pt verbalized understanding. °

## 2013-11-28 ENCOUNTER — Ambulatory Visit: Payer: Medicaid Other | Attending: Neurology | Admitting: Rehabilitative and Restorative Service Providers"

## 2013-11-28 DIAGNOSIS — IMO0001 Reserved for inherently not codable concepts without codable children: Secondary | ICD-10-CM | POA: Diagnosis not present

## 2013-11-28 DIAGNOSIS — M542 Cervicalgia: Secondary | ICD-10-CM | POA: Insufficient documentation

## 2013-12-16 ENCOUNTER — Ambulatory Visit: Payer: Medicaid Other | Attending: Neurology | Admitting: Rehabilitative and Restorative Service Providers"

## 2013-12-16 DIAGNOSIS — Q8501 Neurofibromatosis, type 1: Secondary | ICD-10-CM | POA: Insufficient documentation

## 2013-12-16 DIAGNOSIS — S161XXD Strain of muscle, fascia and tendon at neck level, subsequent encounter: Secondary | ICD-10-CM | POA: Insufficient documentation

## 2013-12-16 DIAGNOSIS — G43009 Migraine without aura, not intractable, without status migrainosus: Secondary | ICD-10-CM | POA: Diagnosis not present

## 2013-12-18 ENCOUNTER — Ambulatory Visit: Payer: Medicaid Other | Admitting: Rehabilitative and Restorative Service Providers"

## 2013-12-23 ENCOUNTER — Ambulatory Visit: Payer: Medicaid Other | Admitting: Physical Therapy

## 2013-12-25 ENCOUNTER — Ambulatory Visit: Payer: Medicaid Other | Admitting: Physical Therapy

## 2013-12-30 ENCOUNTER — Ambulatory Visit: Payer: Medicaid Other | Admitting: Rehabilitative and Restorative Service Providers"

## 2014-01-01 ENCOUNTER — Ambulatory Visit: Payer: Medicaid Other | Admitting: Rehabilitative and Restorative Service Providers"

## 2014-01-05 ENCOUNTER — Encounter: Payer: Self-pay | Admitting: Neurology

## 2014-01-06 ENCOUNTER — Ambulatory Visit: Payer: Medicaid Other | Admitting: Physical Therapy

## 2014-01-08 ENCOUNTER — Ambulatory Visit: Payer: Medicaid Other | Admitting: Physical Therapy

## 2014-02-24 ENCOUNTER — Emergency Department (HOSPITAL_COMMUNITY): Payer: Medicaid Other

## 2014-02-24 ENCOUNTER — Encounter (HOSPITAL_COMMUNITY): Payer: Self-pay | Admitting: Cardiology

## 2014-02-24 ENCOUNTER — Emergency Department (HOSPITAL_COMMUNITY)
Admission: EM | Admit: 2014-02-24 | Discharge: 2014-02-24 | Disposition: A | Discharge status: Home / Self Care | Payer: Medicaid Other | Attending: Emergency Medicine | Admitting: Emergency Medicine

## 2014-02-24 DIAGNOSIS — R110 Nausea: Secondary | ICD-10-CM | POA: Insufficient documentation

## 2014-02-24 DIAGNOSIS — Z3202 Encounter for pregnancy test, result negative: Secondary | ICD-10-CM | POA: Insufficient documentation

## 2014-02-24 DIAGNOSIS — K088 Other specified disorders of teeth and supporting structures: Secondary | ICD-10-CM | POA: Diagnosis present

## 2014-02-24 DIAGNOSIS — R519 Headache, unspecified: Secondary | ICD-10-CM

## 2014-02-24 DIAGNOSIS — K0889 Other specified disorders of teeth and supporting structures: Secondary | ICD-10-CM

## 2014-02-24 DIAGNOSIS — Z79899 Other long term (current) drug therapy: Secondary | ICD-10-CM | POA: Insufficient documentation

## 2014-02-24 DIAGNOSIS — G43009 Migraine without aura, not intractable, without status migrainosus: Secondary | ICD-10-CM | POA: Insufficient documentation

## 2014-02-24 DIAGNOSIS — R51 Headache: Secondary | ICD-10-CM

## 2014-02-24 LAB — POC URINE PREG, ED: PREG TEST UR: NEGATIVE

## 2014-02-24 MED ORDER — HYDROCODONE-ACETAMINOPHEN 5-325 MG PO TABS
1.0000 | ORAL_TABLET | Freq: Once | ORAL | Status: AC
  Administered 2014-02-24: 1 via ORAL
  Filled 2014-02-24: qty 1

## 2014-02-24 MED ORDER — HYDROCODONE-ACETAMINOPHEN 5-325 MG PO TABS: 1.0000 | ORAL_TABLET | Freq: Four times a day (QID) | ORAL | Status: AC | PRN

## 2014-02-24 NOTE — ED Provider Notes (Signed)
CSN: [id number]     Arrival date & time 02/24/14  1255 History   First MD Initiated Contact with Patient 02/24/14 1510     Chief Complaint  Patient presents with  . Dental Pain  . Migraine     (Consider location/radiation/quality/duration/timing/severity/associated sxs/prior Treatment) The history is provided by the patient. No language interpreter was used.  Greta DoomQiana Lamier Foronda is a 33 year old female with past medical history of neurofibromatosis, migraines followed by neurology presenting to the ED with dental pain that started on Sunday. Patient reports that the pain is localized to the right upper jawline described as a constant throbbing sensation with radiation to the right side of the face. Patient reports that she wears a partial and believes that she was wearing it for too long that resulted in her discomfort. Patient reported that she noticed swelling to the right side the cheek. Stated that she was seen and assessed by her dentist yesterday, [facility] where patient was prescribed penicillin and ibuprofen. Patient reports that the ibuprofen is not touching her pain-reported that she has not slept since Sunday secondary to the pain keeping her up. Reported that she started to develop a headache yesterday with a gradual onset localized to the right side of the face that is constant-reported that this is not the worst headache of her life before was at a thunderclap onset. Reported associated nausea without episodes of emesis. Patient reports that the dental pain is worsen her headache, believes that the headache interrupted secondary to dental pain. Denied fever, chills, drainage, bleeding, blurred vision, sudden loss of vision, neck pain, neck stiffness, difficulty swallowing, hemoptysis, confusion, disorientation, weakness/unilateral weakness, numbness, tingling, head injury, chest pain, shortness of breath, difficulty breathing.  PCP Dr. Concepcion ElkAvbuere   Past Medical History  Diagnosis  Date  . Medical history non-contributory   . Migraine   . 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 Migraine without aura, without mention of intractable migraine without mention of status migrainosus 03/31/2013   Past Surgical History  Procedure Laterality Date  . Dilation and curettage of uterus    . Tonsillectomy    . Dilation and curettage of uterus N/A 07/25/2012    Procedure: DILATATION AND CURETTAGE;  Surgeon: Turner Danielsavid C Lowe, MD;  Location: [facility];  Service: Gynecology;  Laterality: N/A;  . Nasal fracture surgery     Family History  Problem Relation Age of Onset  . Hyperlipidemia Mother   . Migraines Mother   . Thyroid disease Mother   . Neurofibromatosis Mother   . Hyperlipidemia Father   . Thyroid disease Maternal Uncle   . Cancer Maternal Grandmother   . Thyroid disease Maternal Grandmother   . Neurofibromatosis Maternal Grandmother   . Neurofibromatosis Brother   . Neurofibromatosis Other    History  Substance Use Topics  . Smoking status: Never Smoker   . Smokeless tobacco: Never Used  . Alcohol Use: Yes     Comment: 1-2 per week   OB History    Gravida Para Term Preterm AB TAB SAB Ectopic Multiple Living   6 3 3  3  3   3      Review of Systems  Constitutional: Negative for fever and chills.  HENT: Positive for dental problem. Negative for sore throat and trouble swallowing.   Eyes: Positive for photophobia. Negative for pain and visual disturbance.  Respiratory: Negative for chest tightness and shortness of breath.   Cardiovascular: Negative for chest pain.  Gastrointestinal: Positive for nausea. Negative for vomiting and  abdominal pain.  Musculoskeletal: Negative for neck pain and neck stiffness.  Neurological: Positive for headaches. Negative for dizziness, weakness and numbness.      Allergies  Review of patient's allergies indicates no known allergies.  Home Medications   Prior to Admission medications   Medication Sig Start Date End Date  Taking? Authorizing Provider  ibuprofen (ADVIL,MOTRIN) 800 MG tablet Take 800 mg by mouth every 8 (eight) hours as needed for mild pain.   Yes Historical Provider, MD  levonorgestrel (MIRENA) 20 MCG/24HR IUD 1 each by Intrauterine route once.   Yes Historical Provider, MD  acetaminophen (TYLENOL) 500 MG tablet Take 1,000 mg by mouth every 6 (six) hours as needed for mild pain. For pain    Historical Provider, MD  cyclobenzaprine (FLEXERIL) 5 MG tablet Take 1 tablet (5 mg total) by mouth 3 (three) times daily. Patient not taking: Reported on 02/24/2014 11/04/13   York Spanielharles K Willis, MD  HYDROcodone-acetaminophen [facility](NORCO) 5-325 MG per tablet Take 2 tablets by mouth every 6 (six) hours as needed. Must last 28 days Patient not taking: Reported on 02/24/2014 11/06/13   York Spanielharles K Willis, MD  HYDROcodone-acetaminophen (NORCO/VICODIN) 5-325 MG per tablet Take 1 tablet by mouth every 6 (six) hours as needed for moderate pain or severe pain. 02/24/14   Latiya Navia, PA-C  naproxen (NAPROSYN) 500 MG tablet Take 1 tablet (500 mg total) by mouth 2 (two) times daily with a meal. Patient not taking: Reported on 02/24/2014 11/04/13   York Spanielharles K Willis, MD   BP 119/74 mmHg  Pulse 70  Temp(Src) 98.2 F (36.8 C) (Oral)  Resp 18  Wt 125 lb (56.7 kg)  SpO2 100%  LMP  Physical Exam  Constitutional: She is oriented to person, place, and time. She appears well-developed and well-nourished. No distress.  HENT:  Head: Normocephalic and atraumatic.  Mouth/Throat: Oropharynx is clear and moist. No oropharyngeal exudate.    Numerous missing teeth identified to the mandibular and maxillary jawline bilaterally. Mild swelling identified to the right cheek with negative changes to skin color. Small diagrammed portion of the right maxillary second premolar noted with negative inflammation, swelling, drainage or bleeding noted to the gumline. Discomfort upon palpation. Negative trismus. Uvula midline with symmetrical elevation.  Negative sublingual lesions.  Eyes: Conjunctivae and EOM are normal. Pupils are equal, round, and reactive to light. Right eye exhibits no discharge. Left eye exhibits no discharge.  Horizontal nystagmus identified, resolvable with time Visual fields grossly intact  Neck: Normal range of motion. Neck supple. No tracheal deviation present.  Negative neck stiffness Negative nuchal rigidity Negative cervical lymphadenopathy Negative meningeal signs  Cardiovascular: Normal rate, regular rhythm and normal heart sounds.  Exam reveals no friction rub.   No murmur heard. Pulses:      Radial pulses are 2+ on the right side, and 2+ on the left side.       Dorsalis pedis pulses are 2+ on the right side, and 2+ on the left side.  Pulmonary/Chest: Effort normal and breath sounds normal. No respiratory distress. She has no wheezes. She has no rales.  Musculoskeletal: Normal range of motion.  Full ROM to upper and lower extremities without difficulty noted, negative ataxia noted.  Lymphadenopathy:    She has no cervical adenopathy.  Neurological: She is alert and oriented to person, place, and time. No cranial nerve deficit. She exhibits normal muscle tone. Coordination normal.  Cranial nerves III-XII grossly intact Strength 5+/5+ to upper and lower extremities bilaterally with resistance applied,  equal distribution noted Equal grip strength bilaterally Negative facial droop Negative slurred speech Negative aphasia Negative arm drift Fine motor skills intact Patient is able to bring finger to nose bilaterally without difficulty or ataxia Patient follows commands well Patient spots to questions appropriately  Skin: Skin is warm and dry. No rash noted. She is not diaphoretic. No erythema.  Psychiatric: She has a normal mood and affect. Her behavior is normal. Thought content normal.  Nursing note and vitals reviewed.   ED Course  Procedures (including critical care time)  Results for orders  placed or performed during the hospital encounter of 02/24/14  POC urine preg, ED (not at [facility])  Result Value Ref Range   Preg Test, Ur NEGATIVE NEGATIVE    Labs Review Labs Reviewed  POC URINE PREG, ED    Imaging Review Ct Head Wo Contrast  02/24/2014   CLINICAL DATA:  Dental pain and headaches  EXAM: CT HEAD WITHOUT CONTRAST  TECHNIQUE: Contiguous axial images were obtained from the base of the skull through the vertex without intravenous contrast.  COMPARISON:  10/28/2013  FINDINGS: The bony calvarium is intact. The ventricles are of normal size and configuration. No findings to suggest acute hemorrhage, acute infarction or space-occupying mass lesion are noted.  IMPRESSION: No acute abnormality is noted.   Electronically Signed   By: Alcide Clever M.D.   On: 02/24/2014 16:57     EKG Interpretation None      MDM   Final diagnoses:  Pain, dental  Intractable headache, unspecified chronicity pattern, unspecified headache type   Medications  HYDROcodone-acetaminophen (NORCO/VICODIN) 5-325 MG per tablet 1 tablet (1 tablet Oral Given 02/24/14 1732)    Filed Vitals:   02/24/14 1317 02/24/14 1500 02/24/14 1600 02/24/14 1800  BP: 112/92 137/82 139/98 119/74  Pulse: 87 84 82 70  Temp: 98.2 F (36.8 C)     TempSrc: Oral     Resp: 18     Weight: 125 lb (56.7 kg)     SpO2: 99% 100% 99% 100%    Urine pregnancy negative. CT head no acute abnormalities noted. Patient given pain medications in ED setting with mild relief of dental pain. Patient reassessed and stated that the headache has been somewhat alleviated secondary to the pain being relieved in the tooth. CT unremarkable. Doubt stroke. Doubt ICH or SAH. Doubt meningitis. Doubt Ludwig's angina. Doubt peritonsillar abscess. Doubt retropharyngeal abscess. Suspicion to be referred headache secondary to dental pain of the right upper jawline. No drainable abscess noted on physical exam. Patient has been seen and assessed by dentist  and given antibiotics and ibuprofen. Patient has history of neurofibromatosis but is followed by a neurologist-CT unremarkable. Discussed case in great detail with attending physician who agrees to plan of care to be discharged. Patient stable, afebrile. Patient not septic appearing. Discharged patient. Discussed with patient to rest and stay hydrated. Discussed with patient to avoid any physical strenuous activity. Discussed with patient to apply warm compressions. Discussed with patient to continue to use antibiotics. Discharge patient with small dose of pain medications-discussed course, cautions, disposal technique. Referred patient to PCP, neurologist, dentist. Discussed with patient to closely monitor symptoms and if symptoms are to worsen or change to report back to the ED - strict return instructions given.  Patient agreed to plan of care, understood, all questions answered.   Raymon Mutton, PA-C 02/24/14 1610  Hilario Quarry, MD 03/02/14 1150

## 2014-02-24 NOTE — Discharge Instructions (Signed)
Please call your doctor for a followup appointment within 24-48 hours. When you talk to your doctor please let them know that you were seen in the emergency department and have them acquire all of your records so that they can discuss the findings with you and formulate a treatment plan to fully care for your new and ongoing problems. Please call and set-up an appointment with your primary care provider Please rest and stay hydrated Please continue to take antibiotics as prescribed and on a full stomach  Please apply cool compressions Please take medications as prescribed - while on pain medications there is to be no drinking alcohol, driving, operating any heavy machinery. If extra please dispose in a proper manner. Please do not take any extra Tylenol with this medication for this can lead to Tylenol overdose and liver issues.  Please continue to monitor symptoms closely and if symptoms are to worsen or change (fever greater than 101, chills, sweating, nausea, vomiting, chest pain, shortness of breathe, difficulty breathing, weakness, numbness, tingling, worsening or changes to pain pattern, facial swelling, neck pain, neck stiffness, neck swelling, inability swallow, changes to voice, visual changes) please report back to the Emergency Department immediately.   General Headache Without Cause A headache is pain or discomfort felt around the head or neck area. The specific cause of a headache may not be found. There are many causes and types of headaches. A few common ones are: Tension headaches. Migraine headaches. Cluster headaches. Chronic daily headaches. HOME CARE INSTRUCTIONS  Keep all follow-up appointments with your caregiver or any specialist referral. Only take over-the-counter or prescription medicines for pain or discomfort as directed by your caregiver. Lie down in a dark, quiet room when you have a headache. Keep a headache journal to find out what may trigger your migraine  headaches. For example, write down: What you eat and drink. How much sleep you get. Any change to your diet or medicines. Try massage or other relaxation techniques. Put ice packs or heat on the head and neck. Use these 3 to 4 times per day for 15 to 20 minutes each time, or as needed. Limit stress. Sit up straight, and do not tense your muscles. Quit smoking if you smoke. Limit alcohol use. Decrease the amount of caffeine you drink, or stop drinking caffeine. Eat and sleep on a regular schedule. Get 7 to 9 hours of sleep, or as recommended by your caregiver. Keep lights dim if bright lights bother you and make your headaches worse. SEEK MEDICAL CARE IF:  You have problems with the medicines you were prescribed. Your medicines are not working. You have a change from the usual headache. You have nausea or vomiting. SEEK IMMEDIATE MEDICAL CARE IF:  Your headache becomes severe. You have a fever. You have a stiff neck. You have loss of vision. You have muscular weakness or loss of muscle control. You start losing your balance or have trouble walking. You feel faint or pass out. You have severe symptoms that are different from your first symptoms. MAKE SURE YOU:  Understand these instructions. Will watch your condition. Will get help right away if you are not doing well or get worse. Document Released: 02/20/2005 Document Revised: 05/15/2011 Document Reviewed: 03/08/2011 [facility]Exit[facility] Patient Information 2015 [place], [place]. This information is not intended to replace advice given to you by your health care provider. Make sure you discuss any questions you have with your health care provider. Dental Pain A tooth ache may be caused by cavities (  tooth decay). Cavities expose the nerve of the tooth to air and hot or cold temperatures. It may come from an infection or abscess (also called a boil or furuncle) around your tooth. It is also often caused by dental caries (tooth decay). This causes  the pain you are having. DIAGNOSIS  Your caregiver can diagnose this problem by exam. TREATMENT   If caused by an infection, it may be treated with medications which kill germs (antibiotics) and pain medications as prescribed by your caregiver. Take medications as directed.  Only take over-the-counter or prescription medicines for pain, discomfort, or fever as directed by your caregiver.  Whether the tooth ache today is caused by infection or dental disease, you should see your dentist as soon as possible for further care. SEEK MEDICAL CARE IF: The exam and treatment you received today has been provided on an emergency basis only. This is not a substitute for complete medical or dental care. If your problem worsens or new problems (symptoms) appear, and you are unable to meet with your dentist, call or return to this location. SEEK IMMEDIATE MEDICAL CARE IF:   You have a fever.  You develop redness and swelling of your face, jaw, or neck.  You are unable to open your mouth.  You have severe pain uncontrolled by pain medicine. MAKE SURE YOU:   Understand these instructions.  Will watch your condition.  Will get help right away if you are not doing well or get worse. Document Released: 02/20/2005 Document Revised: 05/15/2011 Document Reviewed: 10/09/2007 [facility] Patient Information 2015 [place], [place]. This information is not intended to replace advice given to you by your health care provider. Make sure you discuss any questions you have with your health care provider.

## 2014-02-24 NOTE — ED Notes (Signed)
Pt reports that she has been having pain on the right side of her mouth due to a toothache. Also reports that she has a migraine.

## 2014-02-24 NOTE — ED Notes (Signed)
Patient reports dental pain in right upper rear molar for a couple days. States went to her dentist yesterday and was rx atb and ibuprofen. States she has had a headache off and on since yesterday. States she isnt sure it it is because of her dental issue. Complains of some nausea but no vomiting.

## 2014-03-09 ENCOUNTER — Other Ambulatory Visit: Payer: Self-pay | Admitting: Obstetrics and Gynecology

## 2014-03-10 LAB — CYTOLOGY - PAP

## 2014-12-17 IMAGING — CT CT HEAD W/O CM
3 of 7 series · 11 of 33 positions shown, 13 images · IV contrast (Iodine)
Comparison: Brain MR dated 04/15/2013. There is no report available
for that examination.

CLINICAL DATA: Neck pain and stiffness. Recent overseas travel.
Recent fever. No fever currently.

EXAM:
CT HEAD WITHOUT CONTRAST
CT NECK WITH CONTRAST
TECHNIQUE: Contiguous axial images were obtained from the base of the skull
through the vertex without intravenous contrast
Multidetector CT imaging of the and neck was performed using the
standard protocol following the bolus administration of intravenous
contrast.
CONTRAST:  75mL OMNIPAQUE IOHEXOL 300 MG/ML  SOLN

[Series 304: coronals · coronal · 0.39mm/px · 3 of 88 slices shown]
[im 18/88  bone]
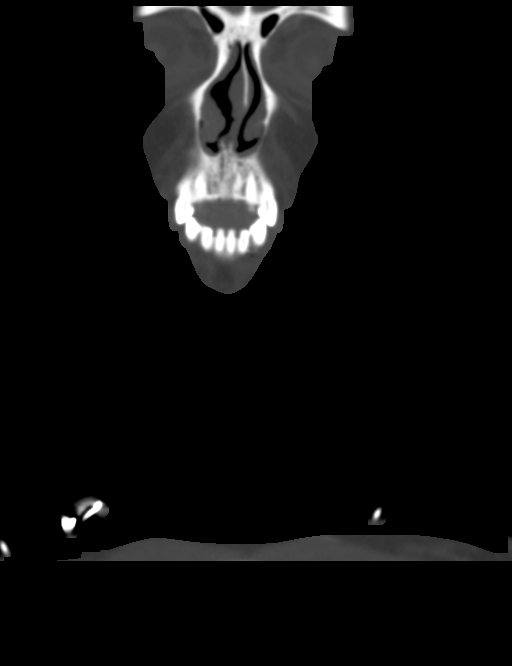
[im 35/88  bone]
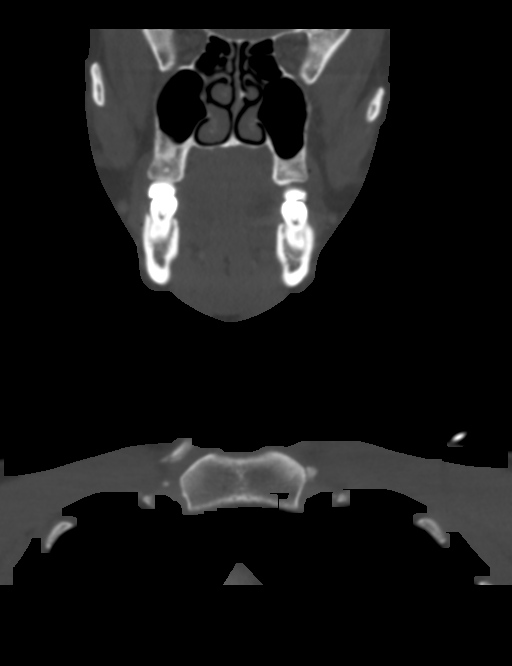
[im 53/88  bone]
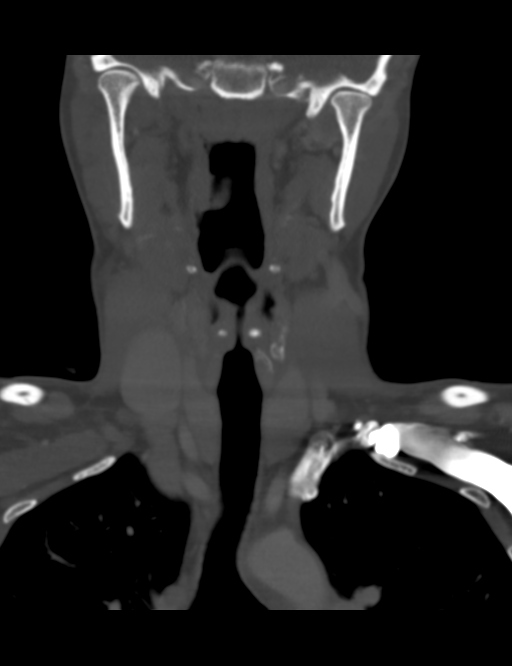

[Series 305: sagittals · sagittal · 0.39mm/px · 5 of 67 slices shown, 6 images]
[im 23/67  bone]
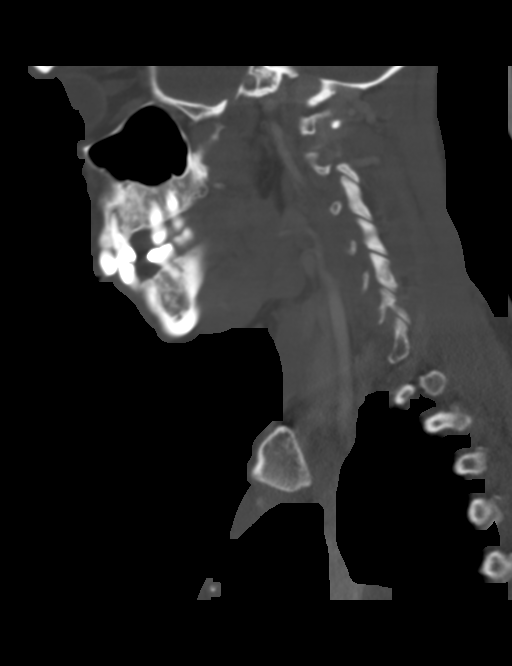
[im 28/67  bone]
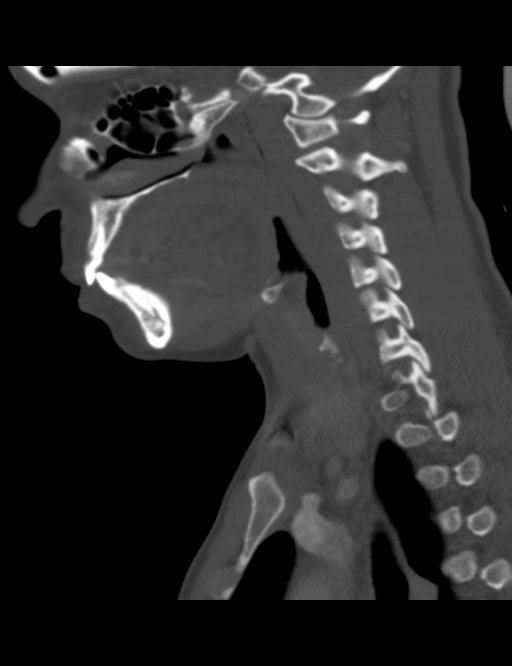
[im 34/67  soft-tissue]
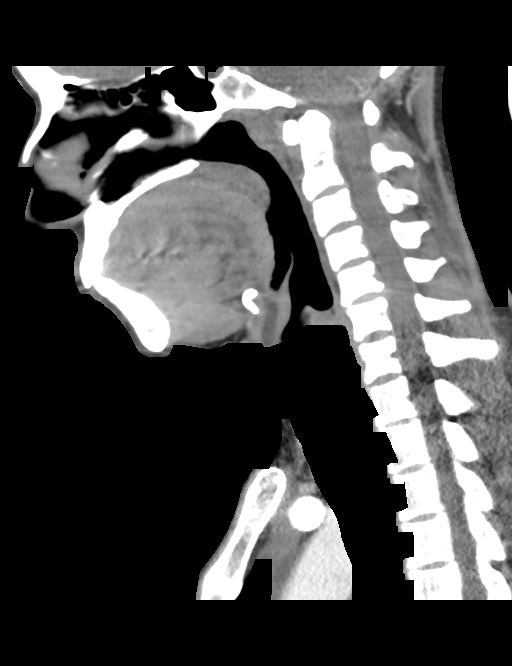
[im 34/67  bone]
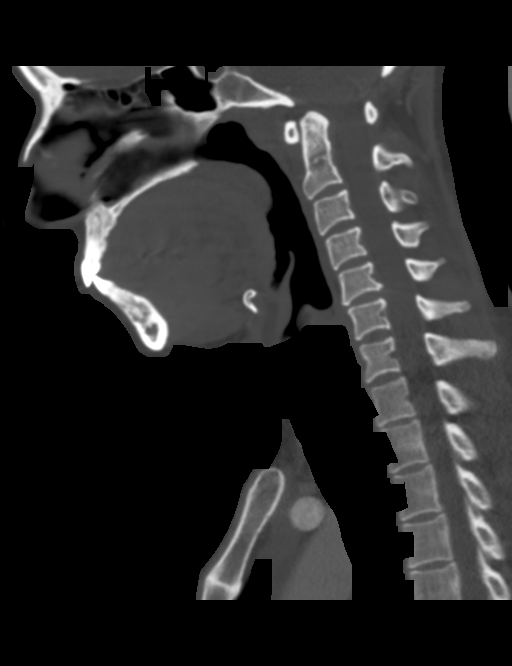
[im 39/67  bone]
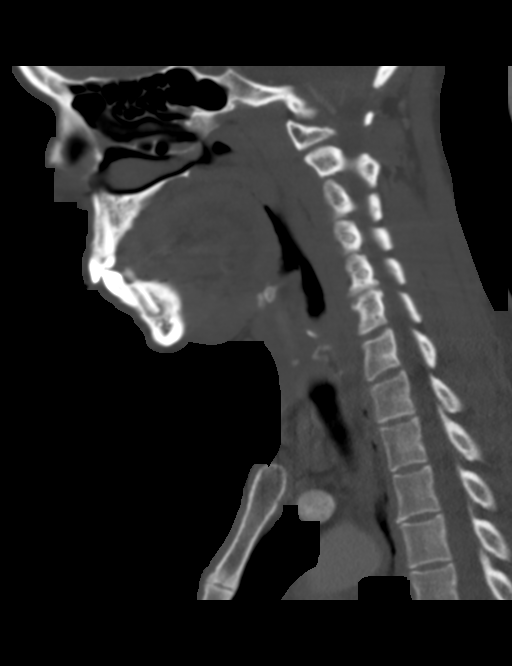
[im 45/67  bone]
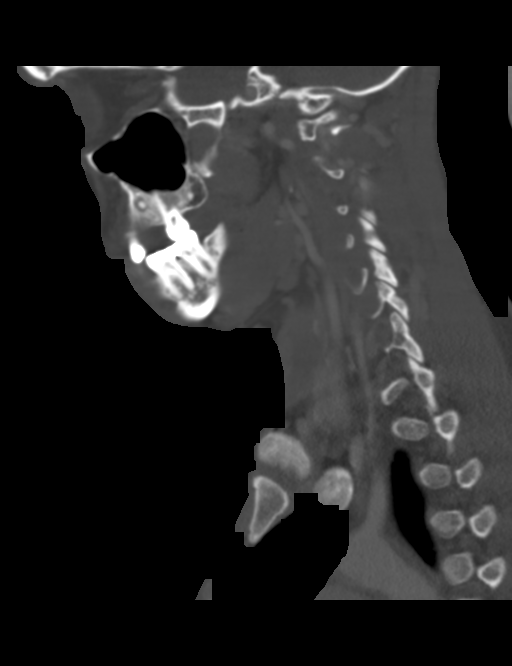

[Series 306: orthogonals · axial · 0.39mm/px · z∈[+117,+214]mm · 3 of 103 slices shown, 4 images]
[im 26/103  soft-tissue]
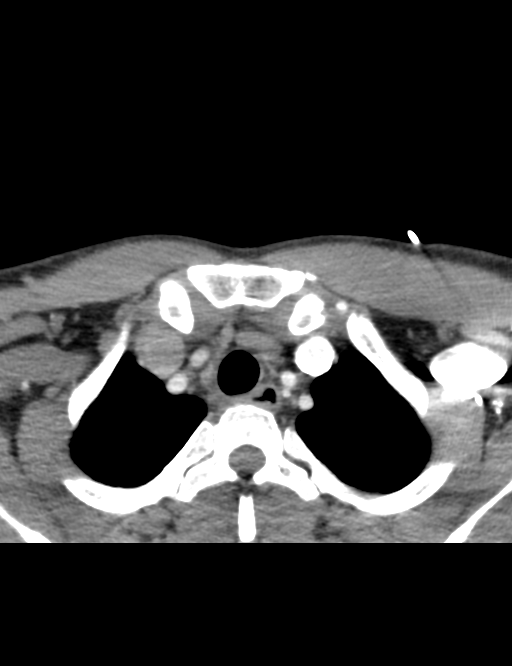
[im 26/103  bone]
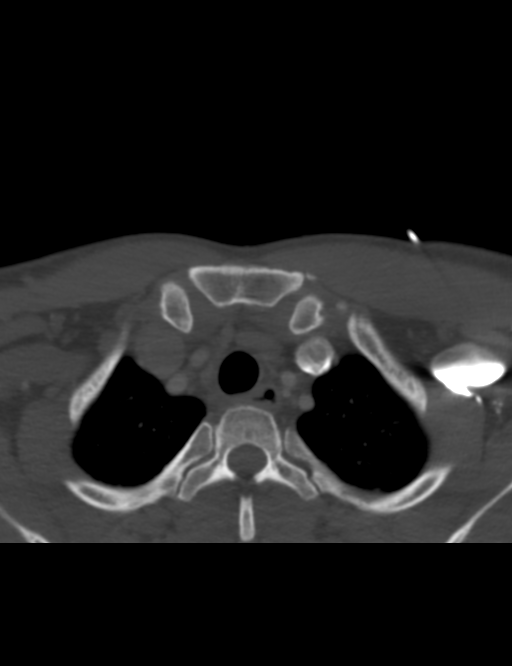
[im 52/103  bone]
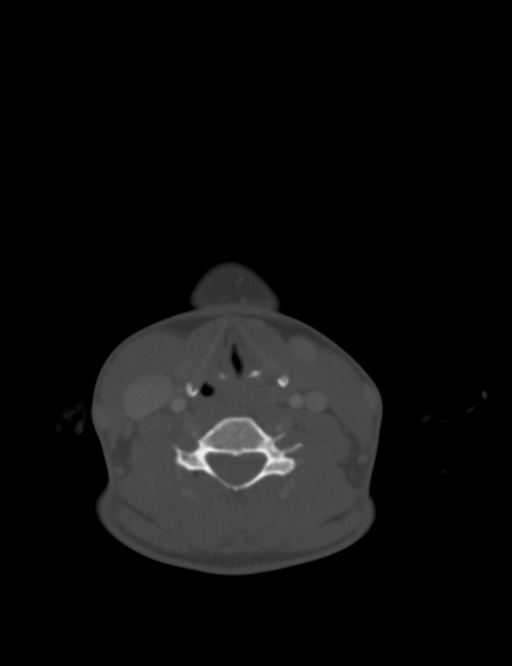
[im 77/103  bone]
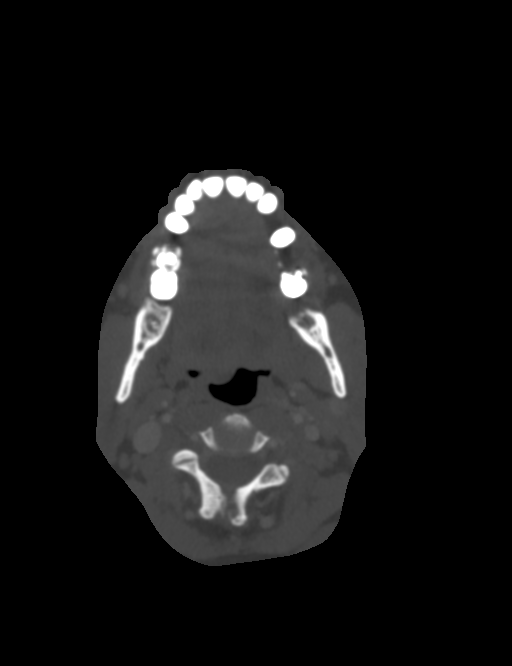

[11 of 33 positions shown; findings below may reference images not displayed]

FINDINGS: CT HEAD FINDINGS

Normal appearing cerebral hemispheres and posterior fossa
structures. Normal size and position of the ventricles. No
intracranial hemorrhage, mass lesion or CT evidence of acute
infarction. Unremarkable bones and included paranasal sinuses.

CT NECK FINDINGS

Mild bullous changes at both lung apices. Oval soft tissue mass
involving the skin and underlying subcutaneous fat in the posterior
neck on the left. This measures 2.7 x 1.3 cm in maximum dimensions
on image number 18 and 2.8 cm in length on coronal image number 85.
No other masses are seen and no enlarged lymph nodes are
demonstrated. The regional bones are unremarkable.
IMPRESSION: 1. Normal head CT.
[DATE] x 2.7 x 1.3 cm superficial mass in the posterior aspect of
the neck on the left. This could represent a large sebaceous cyst or
a neoplastic mass. Correlation with the physical examination
findings is necessary.
3. Mild changes of COPD.

## 2015-05-04 ENCOUNTER — Encounter (HOSPITAL_COMMUNITY): Payer: Self-pay | Admitting: Emergency Medicine

## 2015-05-04 ENCOUNTER — Emergency Department (HOSPITAL_COMMUNITY)
Admission: EM | Admit: 2015-05-04 | Discharge: 2015-05-05 | Disposition: A | Discharge status: Home / Self Care | Payer: Medicaid Other | Attending: Emergency Medicine | Admitting: Emergency Medicine

## 2015-05-04 DIAGNOSIS — Z8679 Personal history of other diseases of the circulatory system: Secondary | ICD-10-CM | POA: Diagnosis not present

## 2015-05-04 DIAGNOSIS — M79605 Pain in left leg: Secondary | ICD-10-CM | POA: Insufficient documentation

## 2015-05-04 DIAGNOSIS — R202 Paresthesia of skin: Secondary | ICD-10-CM | POA: Diagnosis not present

## 2015-05-04 DIAGNOSIS — Q8501 Neurofibromatosis, type 1: Secondary | ICD-10-CM | POA: Diagnosis not present

## 2015-05-04 NOTE — ED Provider Notes (Signed)
CSN: [id number]     Arrival date & time 05/04/15  2300 History  By signing my name below, I, Octavia Heir, attest that this documentation has been prepared under the direction and in the presence of Arthor Captain, PA-C. Electronically Signed: Octavia Heir, ED Scribe. 05/04/2015. 11:13 PM.     Chief Complaint  Patient presents with  . Leg Pain      The history is provided by the patient. No language interpreter was used.   HPI Comments: Melissa Mathews is a 35 y.o. female who presents to the Emergency Department complaining of constant, gradual worsening, sharp, tingling, left leg pain onset today. She states she was cleaning and moving heavy objects today when she began to feel pain in her knee shortly after. There is no swelling or redness to the area. She has not taken any medication to alleviate the pain. Pt denies any injury to the area.  Past Medical History  Diagnosis Date  . Medical history non-contributory   . Migraine   . 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 Migraine without aura, without mention of intractable migraine without mention of status migrainosus 03/31/2013   Past Surgical History  Procedure Laterality Date  . Dilation and curettage of uterus    . Tonsillectomy    . Dilation and curettage of uterus N/A 07/25/2012    Procedure: DILATATION AND CURETTAGE;  Surgeon: Turner Daniels, MD;  Location: [facility];  Service: Gynecology;  Laterality: N/A;  . Nasal fracture surgery     Family History  Problem Relation Age of Onset  . Hyperlipidemia Mother   . Migraines Mother   . Thyroid disease Mother   . Neurofibromatosis Mother   . Hyperlipidemia Father   . Thyroid disease Maternal Uncle   . Cancer Maternal Grandmother   . Thyroid disease Maternal Grandmother   . Neurofibromatosis Maternal Grandmother   . Neurofibromatosis Brother   . Neurofibromatosis Other    Social History  Substance Use Topics  . Smoking status: Never Smoker   . Smokeless  tobacco: Never Used  . Alcohol Use: Yes     Comment: 1-2 per week   OB History    Gravida Para Term Preterm AB TAB SAB Ectopic Multiple Living   Review of Systems  Musculoskeletal: Positive for arthralgias.  All other systems reviewed and are negative.     Allergies  Review of patient's allergies indicates no known allergies.  Home Medications   Prior to Admission medications   Medication Sig Start Date End Date Taking? Authorizing Provider  acetaminophen (TYLENOL) 500 MG tablet Take 1,000 mg by mouth every 6 (six) hours as needed for mild pain. For pain    Historical Provider, MD  cyclobenzaprine (FLEXERIL) 5 MG tablet Take 1 tablet (5 mg total) by mouth 3 (three) times daily. Patient not taking: Reported on 02/24/2014 11/04/13   York Spaniel, MD  HYDROcodone-acetaminophen [facility]) 5-325 MG per tablet Take 2 tablets by mouth every 6 (six) hours as needed. Must last 28 days Patient not taking: Reported on 02/24/2014 11/06/13   York Spaniel, MD  HYDROcodone-acetaminophen (NORCO/VICODIN) 5-325 MG per tablet Take 1 tablet by mouth every 6 (six) hours as needed for moderate pain or severe pain. 02/24/14   Marissa Sciacca, PA-C  ibuprofen (ADVIL,MOTRIN) 800 MG tablet Take 800 mg by mouth every 8 (eight) hours as needed for mild pain.    Historical Provider, MD  levonorgestrel (MIRENA) 20 MCG/24HR IUD 1 each by Intrauterine route once.    Historical Provider, MD  naproxen (NAPROSYN) 500 MG tablet Take 1 tablet (500 mg total) by mouth 2 (two) times daily with a meal. Patient not taking: Reported on 02/24/2014 11/04/13   York Spaniel, MD   Triage vitals: BP 111/83 mmHg  Pulse 74  Temp(Src) 97.6 F (36.4 C) (Oral)  Resp 14  SpO2 100% Physical Exam  Constitutional: She is oriented to person, place, and time. She appears well-developed and well-nourished.  HENT:  Head: Normocephalic.  Eyes: EOM are normal.  Neck: Normal range of motion.  Pulmonary/Chest:  Effort normal.  Abdominal: She exhibits no distension.  Musculoskeletal: Normal range of motion.  TTP left calf, , shin and adductor magnus. No sig swelling. NVI  Neurological: She is alert and oriented to person, place, and time.  Psychiatric: She has a normal mood and affect.  Nursing note and vitals reviewed.   ED Course  Procedures  DIAGNOSTIC STUDIES: Oxygen Saturation is 100% on RA, normal by my interpretation.  COORDINATION OF CARE:  12:14 AM Discussed treatment plan with pt at bedside and pt agreed to plan.  Labs Review Labs Reviewed - No data to display  Imaging Review No results found. I have personally reviewed and evaluated these images and lab results as part of my medical decision-making.   EKG Interpretation None      MDM   Final diagnoses:  Leg pain, diffuse, left     Patient with leg pain. Is suspect msk origin, but patient states it feels different and is concerned for DVT. Patient will be given Lovenox. She is to return tomorrow for DVT study. Discharged with naproxen. Appears safe for discharge at this time. I personally performed the services described in this documentation, which was scribed in my presence. The recorded information has been reviewed and is accurate.      Arthor Captain, PA-C 05/05/15 0024  Geoffery Lyons, MD 05/05/15 [id number]

## 2015-05-04 NOTE — ED Notes (Signed)
Pt states today she started having pain in the left leg on the out side of her left knee into her shin. Pt states she has been doing do spring cleaning at home and been on her feet a lot today but denies any injury. No swelling or redness noted to left leg. Pedal pulses equal +2 bilaterally.

## 2015-05-05 ENCOUNTER — Ambulatory Visit (HOSPITAL_COMMUNITY): Admission: RE | Admit: 2015-05-05 | Payer: Medicaid Other | Source: Ambulatory Visit

## 2015-05-05 MED ORDER — NAPROXEN 500 MG PO TABS: 500.0000 mg | ORAL_TABLET | Freq: Two times a day (BID) | ORAL | Status: AC

## 2015-05-05 MED ORDER — ENOXAPARIN SODIUM 60 MG/0.6ML ~~LOC~~ SOLN
60.0000 mg | Freq: Once | SUBCUTANEOUS | Status: AC
  Administered 2015-05-05: 60 mg via SUBCUTANEOUS
  Filled 2015-05-05: qty 0.6

## 2015-05-05 NOTE — Discharge Instructions (Signed)
°Musculoskeletal Pain °Musculoskeletal pain is muscle and boney aches and pains. These pains can occur in any part of the body. Your caregiver may treat you without knowing the cause of the pain. They may treat you if blood or urine tests, X-rays, and other tests were normal.  °CAUSES °There is often not a definite cause or reason for these pains. These pains may be caused by a type of germ (virus). The discomfort may also come from overuse. Overuse includes working out too hard when your body is not fit. Boney aches also come from weather changes. Bone is sensitive to atmospheric pressure changes. °HOME CARE INSTRUCTIONS  °· Ask when your test results will be ready. Make sure you get your test results. °· Only take over-the-counter or prescription medicines for pain, discomfort, or fever as directed by your caregiver. If you were given medications for your condition, do not drive, operate machinery or power tools, or sign legal documents for 24 hours. Do not drink alcohol. Do not take sleeping pills or other medications that may interfere with treatment. °· Continue all activities unless the activities cause more pain. When the pain lessens, slowly resume normal activities. Gradually increase the intensity and duration of the activities or exercise. °· During periods of severe pain, bed rest may be helpful. Lay or sit in any position that is comfortable. °· Putting ice on the injured area. °· Put ice in a bag. °· Place a towel between your skin and the bag. °· Leave the ice on for 15 to 20 minutes, 3 to 4 times a day. °· Follow up with your caregiver for continued problems and no reason can be found for the pain. If the pain becomes worse or does not go away, it may be necessary to repeat tests or do additional testing. Your caregiver may need to look further for a possible cause. °SEEK IMMEDIATE MEDICAL CARE IF: °· You have pain that is getting worse and is not relieved by medications. °· You develop chest pain  that is associated with shortness or breath, sweating, feeling sick to your stomach (nauseous), or throw up (vomit). °· Your pain becomes localized to the abdomen. °· You develop any new symptoms that seem different or that concern you. °MAKE SURE YOU:  °· Understand these instructions. °· Will watch your condition. °· Will get help right away if you are not doing well or get worse. °  °This information is not intended to replace advice given to you by your health care provider. Make sure you discuss any questions you have with your health care provider. °  °Document Released: 02/20/2005 Document Revised: 05/15/2011 Document Reviewed: 10/25/2012 °Elsevier Interactive Patient Education ©2016 [employer] °Deep Vein Thrombosis °A deep vein thrombosis (DVT) is a blood clot (thrombus) that usually occurs in a deep, larger vein of the lower leg or the pelvis, or in an upper extremity such as the arm. These are dangerous and can lead to serious and even life-threatening complications if the clot travels to the lungs. °A DVT can damage the valves in your leg veins so that instead of flowing upward, the blood pools in the lower leg. This is called post-thrombotic syndrome, and it can result in pain, swelling, discoloration, and sores on the leg. °CAUSES °A DVT is caused by the formation of a blood clot in your leg, pelvis, or arm. Usually, several things contribute to the formation of blood clots. A clot may develop when: °· Your blood flow slows down. °· Your   vein becomes damaged in some way. °· You have a condition that makes your blood clot more easily. °RISK FACTORS °A DVT is more likely to develop in: °· People who are older, especially over 60 years of age. °· People who are overweight (obese). °· People who sit or lie still for a long time, such as during long-distance travel (over 4 hours), bed rest, hospitalization, or during recovery from certain medical conditions like a stroke. °· People who do not engage in  much physical activity (sedentary lifestyle). °· People who have chronic breathing disorders. °· People who have a personal or family history of blood clots or blood clotting disease. °· People who have peripheral vascular disease (PVD), diabetes, or some types of cancer. °· People who have heart disease, especially if the person had a recent heart attack or has congestive heart failure. °· People who have neurological diseases that affect the legs (leg paresis). °· People who have had a traumatic injury, such as breaking a hip or leg. °· People who have recently had major or lengthy surgery, especially on the hip, knee, or abdomen. °· People who have had a central line placed inside a large vein. °· People who take medicines that contain the hormone estrogen. These include birth control pills and hormone replacement therapy. °· Pregnancy or during childbirth or the postpartum period. °· Long plane flights (over 8 hours). °SIGNS AND SYMPTOMS °Symptoms of a DVT can include:  °· Swelling of your leg or arm, especially if one side is much worse. °· Warmth and redness of your leg or arm, especially if one side is much worse. °· Pain in your arm or leg. If the clot is in your leg, symptoms may be more noticeable or worse when you stand or walk. °· A feeling of pins and needles, if the clot is in the arm. °The symptoms of a DVT that has traveled to the lungs (pulmonary embolism, PE) usually start suddenly and include: °· Shortness of breath while active or at rest. °· Coughing or coughing up blood or blood-tinged mucus. °· Chest pain that is often worse with deep breaths. °· Rapid or irregular heartbeat. °· Feeling light-headed or dizzy. °· Fainting. °· Feeling anxious. °· Sweating. °There may also be pain and swelling in a leg if that is where the blood clot started. °These symptoms may represent a serious problem that is an emergency. Do not wait to see if the symptoms will go away. Get medical help right away. Call  your local emergency services (911 in the [place] Do not drive yourself to the hospital. °DIAGNOSIS °Your health care provider will take a medical history and perform a physical exam. You may also have other tests, including: °· Blood tests to assess the clotting properties of your blood. °· Imaging tests, such as CT, ultrasound, MRI, X-ray, and other tests to see if you have clots anywhere in your body. °TREATMENT °After a DVT is identified, it can be treated. The type of treatment that you receive depends on many factors, such as the cause of your DVT, your risk for bleeding or developing more clots, and other medical conditions that you have. Sometimes, a combination of treatments is necessary. Treatment options may be combined and include: °· Monitoring the blood clot with ultrasound. °· Taking medicines by mouth, such as newer blood thinners (anticoagulants), thrombolytics, or warfarin. °· Taking anticoagulant medicine by injection or through an IV tube. °· Wearing compression stockings or using different types of devices. °· Surgery (  rare) to remove the blood clot or to place a filter in your abdomen to stop the blood clot from traveling to your lungs. °Treatments for a DVT are often divided into immediate treatment and long-term treatment (up to 3 months after DVT). You can work with your health care provider to choose the treatment program that is best for you. °HOME CARE INSTRUCTIONS °If you are taking a newer oral anticoagulant: °· Take the medicine every single day at the same time each day. °· Understand what foods and drugs interact with this medicine. °· Understand that there are no regular blood tests required when using this medicine. °· Understand the side effects of this medicine, including excessive bruising or bleeding. Ask your health care provider or pharmacist about other possible side effects. °If you are taking warfarin: °· Understand how to take warfarin and know which foods can affect how  warfarin works in your body. °· Understand that it is dangerous to take too much or too little warfarin. Too much warfarin increases the risk of bleeding. Too little warfarin continues to allow the risk for blood clots. °· Follow your PT and INR blood testing schedule. The PT and INR results allow your health care provider to adjust your dose of warfarin. It is very important that you have your PT and INR tested as often as told by your health care provider. °· Avoid major changes in your diet, or tell your health care provider before you change your diet. Arrange a visit with a registered dietitian to answer your questions. Many foods, especially foods that are high in vitamin K, can interfere with warfarin and affect the PT and INR results. Eat a consistent amount of foods that are high in vitamin K, such as: °¨ Spinach, kale, broccoli, cabbage, collard greens, turnip greens, Brussels sprouts, peas, cauliflower, seaweed, and parsley. °¨ Beef liver and pork liver. °¨ Green tea. °¨ Soybean oil. °· Tell your health care provider about any and all medicines, vitamins, and supplements that you take, including aspirin and other over-the-counter anti-inflammatory medicines. Be especially cautious with aspirin and anti-inflammatory medicines. Do not take those before you ask your health care provider if it is safe to do so. This is important because many medicines can interfere with warfarin and affect the PT and INR results. °· Do not start or stop taking any over-the-counter or prescription medicine unless your health care provider or pharmacist tells you to do so. °If you take warfarin, you will also need to do these things: °· Hold pressure over cuts for longer than usual. °· Tell your dentist and other health care providers that you are taking warfarin before you have any procedures in which bleeding may occur. °· Avoid alcohol or drink very small amounts. Tell your health care provider if you change your alcohol  intake. °· Do not use tobacco products, including cigarettes, chewing tobacco, and e-cigarettes. If you need help quitting, ask your health care provider. °· Avoid contact sports. °General Instructions °· Take over-the-counter and prescription medicines only as told by your health care provider. Anticoagulant medicines can have side effects, including easy bruising and difficulty stopping bleeding. If you are prescribed an anticoagulant, you will also need to do these things: °¨ Hold pressure over cuts for longer than usual. °¨ Tell your dentist and other health care providers that you are taking anticoagulants before you have any procedures in which bleeding may occur. °¨ Avoid contact sports. °· Wear a medical alert bracelet or carry a   medical alert card that says you have had a PE. °· Ask your health care provider how soon you can go back to your normal activities. Stay active to prevent new blood clots from forming. °· Make sure to exercise while traveling or when you have been sitting or standing for a long period of time. It is very important to exercise. Exercise your legs by walking or by tightening and relaxing your leg muscles often. Take frequent walks. °· Wear compression stockings as told by your health care provider to help prevent more blood clots from forming. °· Do not use tobacco products, including cigarettes, chewing tobacco, and e-cigarettes. If you need help quitting, ask your health care provider. °· Keep all follow-up appointments with your health care provider. This is important. °PREVENTION °Take these actions to decrease your risk of developing another DVT: °· Exercise regularly. For at least 30 minutes every day, engage in: °¨ Activity that involves moving your arms and legs. °¨ Activity that encourages good blood flow through your body by increasing your heart rate. °· Exercise your arms and legs every hour during long-distance travel (over 4 hours). Drink plenty of water and avoid  drinking alcohol while traveling. °· Avoid sitting or lying in bed for long periods of time without moving your legs. °· Maintain a weight that is appropriate for your height. Ask your health care provider what weight is healthy for you. °· If you are a woman who is over 35 years of age, avoid unnecessary use of medicines that contain estrogen. These include birth control pills. °· Do not smoke, especially if you take estrogen medicines. If you need help quitting, ask your health care provider. °If you are hospitalized, prevention measures may include: °· Early walking after surgery, as soon as your health care provider says that it is safe. °· Receiving anticoagulants to prevent blood clots. If you cannot take anticoagulants, other options may be available, such as wearing compression stockings or using different types of devices. °SEEK IMMEDIATE MEDICAL CARE IF: °· You have new or increased pain, swelling, or redness in an arm or leg. °· You have numbness or tingling in an arm or leg. °· You have shortness of breath while active or at rest. °· You have chest pain. °· You have a rapid or irregular heartbeat. °· You feel light-headed or dizzy. °· You cough up blood. °· You notice blood in your vomit, bowel movement, or urine. °These symptoms may represent a serious problem that is an emergency. Do not wait to see if the symptoms will go away. Get medical help right away. Call your local emergency services (911 in the [place] Do not drive yourself to the hospital. °  °This information is not intended to replace advice given to you by your health care provider. Make sure you discuss any questions you have with your health care provider. °  °Document Released: 02/20/2005 Document Revised: 11/11/2014 Document Reviewed: 06/17/2014 °Elsevier Interactive Patient Education ©2016 Elsevier Inc. ° °

## 2015-05-05 NOTE — ED Notes (Signed)
Called pharmacy to follow up on Lovenox

## 2015-05-05 NOTE — ED Notes (Signed)
Patient able to ambulate independently  

## 2018-01-13 ENCOUNTER — Ambulatory Visit (HOSPITAL_COMMUNITY)
Admission: EM | Admit: 2018-01-13 | Discharge: 2018-01-13 | Disposition: A | Discharge status: Home / Self Care | Payer: Medicaid Other | Attending: Physician Assistant | Admitting: Physician Assistant

## 2018-01-13 ENCOUNTER — Encounter (HOSPITAL_COMMUNITY): Payer: Self-pay

## 2018-01-13 DIAGNOSIS — K0889 Other specified disorders of teeth and supporting structures: Secondary | ICD-10-CM | POA: Diagnosis not present

## 2018-01-13 MED ORDER — TRAMADOL HCL 50 MG PO TABS: 50.0000 mg | ORAL_TABLET | Freq: Four times a day (QID) | ORAL | 0 refills | Status: AC | PRN

## 2018-01-13 MED ORDER — AMOXICILLIN 500 MG PO CAPS
500.0000 mg | ORAL_CAPSULE | Freq: Three times a day (TID) | ORAL | 0 refills | Status: AC
Start: 2018-01-13 — End: ?

## 2018-01-13 NOTE — ED Triage Notes (Signed)
Pt presents with dental pain on right side; currently having oral work done on left side.

## 2018-01-13 NOTE — Discharge Instructions (Signed)
Call your Dentist tomorrow to be seen for evaluation °

## 2018-01-13 NOTE — ED Provider Notes (Signed)
[facility]    CSN: [id number] Arrival date & time: 01/13/18  1436     History   Chief Complaint Chief Complaint  Patient presents with  . Dental Pain    HPI Melissa Mathews is a 37 y.o. female.   The history is provided by the patient. No language interpreter was used.  Dental Pain  Location:  Upper Quality:  Aching Severity:  Moderate Onset quality:  Gradual Timing:  Constant Progression:  Worsening Chronicity:  New Previous work-up:  Dental exam Relieved by:  Nothing Worsened by:  Nothing Associated symptoms: no congestion   Risk factors: no diabetes   Pt complains of pain in right upper and lower mouth.  Pt reports she is having a bridge made for her mouth.  Pt reports shis is the opossite side.   Past Medical History:  Diagnosis Date  . Medical history non-contributory   . Migraine   . Migraine without aura, without mention of intractable migraine without mention of status migrainosus 03/31/2013  . 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 Sprain of neck 11/04/2013  . 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 Migraine without aura, without mention of intractable migraine without mention of status migrainosus 03/31/2013  . Normal spontaneous vaginal delivery 07/27/2012    Class: Status post  . Retained placenta 07/27/2012    Class: Status post  . S/P dilatation and curettage 07/27/2012    Class: Status post    Past Surgical History:  Procedure Laterality Date  . DILATION AND CURETTAGE OF UTERUS    . DILATION AND CURETTAGE OF UTERUS N/A 07/25/2012   Procedure: DILATATION AND CURETTAGE;  Surgeon: Turner Daniels, MD;  Location: [facility];  Service: Gynecology;  Laterality: N/A;  . NASAL FRACTURE SURGERY    . TONSILLECTOMY      OB History    Gravida  6   Para  3   Term  3   Preterm      AB  3   Living  3     SAB  3   TAB      Ectopic      Multiple      Live Births  3            Home Medications    Prior to Admission medications   Medication Sig Start Date End Date Taking? Authorizing Provider  acetaminophen (TYLENOL) 500 MG tablet Take 1,000 mg by mouth every 6 (six) hours as needed for mild pain. For pain    [provider]  amoxicillin (AMOXIL) 500 MG capsule Take 1 capsule (500 mg total) by mouth 3 (three) times daily. 01/13/18   Elson Areas, PA-C  cyclobenzaprine (FLEXERIL) 5 MG tablet Take 1 tablet (5 mg total) by mouth 3 (three) times daily. Patient not taking: Reported on 02/24/2014 11/04/13   York Spaniel, MD  HYDROcodone-acetaminophen [facility]) 5-325 MG per tablet Take 2 tablets by mouth every 6 (six) hours as needed. Must last 28 days Patient not taking: Reported on 02/24/2014 11/06/13   York Spaniel, MD  HYDROcodone-acetaminophen (NORCO/VICODIN) 5-325 MG per tablet Take 1 tablet by mouth every 6 (six) hours as needed for moderate pain or severe pain. 02/24/14   Sciacca, Marissa, PA-C  ibuprofen (ADVIL,MOTRIN) 800 MG tablet Take 800 mg by mouth every 8 (eight) hours as needed for mild pain.    [provider]  levonorgestrel (MIRENA) 20  MCG/24HR IUD 1 each by Intrauterine route once.    [provider]  naproxen (NAPROSYN) 500 MG tablet Take 1 tablet (500 mg total) by mouth 2 (two) times daily with a meal. 05/05/15   Harris, Abigail, PA-C  traMADol (ULTRAM) 50 MG tablet Take 1 tablet (50 mg total) by mouth every 6 (six) hours as needed. 01/13/18   Elson Areas, PA-C    Family History Family History  Problem Relation Age of Onset  . Hyperlipidemia Mother   . Migraines Mother   . Thyroid disease Mother   . Neurofibromatosis Mother   . Hyperlipidemia Father   . Cancer Maternal Grandmother   . Thyroid disease Maternal Grandmother   . Neurofibromatosis Maternal Grandmother   . Neurofibromatosis Brother   . Neurofibromatosis Other   . Thyroid disease Maternal Uncle      Social History Social History   Tobacco Use  . Smoking status: Never Smoker  . Smokeless tobacco: Never Used  Substance Use Topics  . Alcohol use: Yes    Comment: 1-2 per week  . Drug use: No     Allergies   Patient has no known allergies.   Review of Systems Review of Systems  HENT: Negative for congestion.   All other systems reviewed and are negative.    Physical Exam Triage Vital Signs ED Triage Vitals  Enc Vitals Group     BP 01/13/18 1551 127/76     Pulse Rate 01/13/18 1551 66     Resp 01/13/18 1551 20     Temp 01/13/18 1551 98.5 F (36.9 C)     Temp Source 01/13/18 1551 Oral     SpO2 01/13/18 1551 99 %     Weight --      Height --      Head Circumference --      Peak Flow --      Pain Score 01/13/18 1553 9     Pain Loc --      Pain Edu? --      Excl. in GC? --    No data found.  Updated Vital Signs BP 127/76 (BP Location: Right Arm)   Pulse 66   Temp 98.5 F (36.9 C) (Oral)   Resp 20   SpO2 99%   Visual Acuity Right Eye Distance:   Left Eye Distance:   Bilateral Distance:    Right Eye Near:   Left Eye Near:    Bilateral Near:     Physical Exam  Constitutional: She appears well-developed and well-nourished.  HENT:  Head: Normocephalic.  Right Ear: External ear normal.  Left Ear: External ear normal.  Mouth/Throat: Oropharynx is clear and moist.  Eyes: Pupils are equal, round, and reactive to light. Conjunctivae and EOM are normal.  Cardiovascular: Normal rate.  Pulmonary/Chest: Effort normal.  Musculoskeletal: Normal range of motion.  Neurological: She is alert.  Skin: Skin is warm.  Psychiatric: She has a normal mood and affect.  Nursing note and vitals reviewed.    UC Treatments / Results  Labs (all labs ordered are listed, but only abnormal results are displayed) Labs Reviewed - No data to display  EKG None  Radiology No results found.  Procedures Procedures (including critical care time)  Medications  Ordered in UC Medications - No data to display  Initial Impression / Assessment and Plan / UC Course  I have reviewed the triage vital signs and the nursing notes.  Pertinent labs & imaging results that were available during my  care of the patient were reviewed by me and considered in my medical decision making (see chart for details).      Final Clinical Impressions(s) / UC Diagnoses   Final diagnoses:  Toothache  Pain, dental     Discharge Instructions     Call your Dentist tomorrow to be seen for evaluation    ED Prescriptions    Medication Sig Dispense Auth. Provider   amoxicillin (AMOXIL) 500 MG capsule Take 1 capsule (500 mg total) by mouth 3 (three) times daily. 21 capsule Kahliyah Dick K, [place]   traMADol (ULTRAM) 50 MG tablet Take 1 tablet (50 mg total) by mouth every 6 (six) hours as needed. 15 tablet Elson Areas, [place]     Controlled Substance Prescriptions [place] Controlled Substance Registry consulted? Not Applicable  An After Visit Summary was printed and given to the patient.    Elson Areas, [place] 01/13/18 1649

## 2018-09-12 NOTE — H&P (Signed)
Neeka Urista is an 38 y.o. female presents for surgical sterilization.      Menstrual History: No LMP recorded. (Menstrual status: IUD).    Past Medical History:  Diagnosis Date  . Medical history non-contributory   . Migraine   . Migraine without aura, without mention of intractable migraine without mention of status migrainosus 03/31/2013  . 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 DILATION AND CURETTAGE OF UTERUS    . DILATION AND CURETTAGE OF UTERUS N/A 07/25/2012   Procedure: DILATATION AND CURETTAGE;  Surgeon: Luz Lex, MD;  Location: [facility];  Service: Gynecology;  Laterality: N/A;  . NASAL FRACTURE SURGERY    . TONSILLECTOMY      Family History  Problem Relation Age of Onset  . Hyperlipidemia Mother   . Migraines Mother   . Thyroid disease Mother   . Neurofibromatosis Mother   . Hyperlipidemia Father   . Cancer Maternal Grandmother   . Thyroid disease Maternal Grandmother   . Neurofibromatosis Maternal Grandmother   . Neurofibromatosis Brother   . Neurofibromatosis Other   . Thyroid disease Maternal Uncle     Social History:  reports that she has never smoked. She has never used smokeless tobacco. She reports current alcohol use. She reports that she does not use drugs.  Allergies: No Known Allergies  No medications prior to admission.    ROS  Physical Exam  Gen - NAD Abd - soft, NT/ND Ext - NT, no edema PV - uterus mobile, NT   Assessment/Plan:  Desires sterilization Laparoscopic tubal ligation with filshie clips  Marylynn Pearson 09/12/2018, 9:10 AM

## 2018-10-07 ENCOUNTER — Encounter (HOSPITAL_BASED_OUTPATIENT_CLINIC_OR_DEPARTMENT_OTHER): Payer: Self-pay

## 2018-10-07 ENCOUNTER — Ambulatory Visit (HOSPITAL_BASED_OUTPATIENT_CLINIC_OR_DEPARTMENT_OTHER): Admit: 2018-10-07 | Payer: BLUE CROSS/BLUE SHIELD | Admitting: Obstetrics and Gynecology

## 2018-10-07 SURGERY — LIGATION, FALLOPIAN TUBE, LAPAROSCOPIC
Anesthesia: General | Laterality: Bilateral

## 2018-10-24 ENCOUNTER — Other Ambulatory Visit: Payer: Self-pay | Admitting: Radiology

## 2018-11-15 ENCOUNTER — Emergency Department (HOSPITAL_COMMUNITY)
Admission: EM | Admit: 2018-11-15 | Discharge: 2018-11-16 | Disposition: A | Discharge status: Home / Self Care | Payer: BLUE CROSS/BLUE SHIELD | Attending: Emergency Medicine | Admitting: Emergency Medicine

## 2018-11-15 ENCOUNTER — Other Ambulatory Visit: Payer: Self-pay

## 2018-11-15 DIAGNOSIS — K625 Hemorrhage of anus and rectum: Secondary | ICD-10-CM | POA: Diagnosis not present

## 2018-11-15 DIAGNOSIS — Z5321 Procedure and treatment not carried out due to patient leaving prior to being seen by health care provider: Secondary | ICD-10-CM | POA: Diagnosis not present

## 2018-11-15 DIAGNOSIS — R109 Unspecified abdominal pain: Secondary | ICD-10-CM | POA: Diagnosis present

## 2018-11-15 LAB — COMPREHENSIVE METABOLIC PANEL
ALT: 15 U/L (ref 0–44)
AST: 14 U/L — ABNORMAL LOW (ref 15–41)
Albumin: 4.1 g/dL (ref 3.5–5.0)
Alkaline Phosphatase: 40 U/L (ref 38–126)
Anion gap: 8 (ref 5–15)
BUN: 8 mg/dL (ref 6–20)
CO2: 26 mmol/L (ref 22–32)
Calcium: 8.9 mg/dL (ref 8.9–10.3)
Chloride: 108 mmol/L (ref 98–111)
Creatinine, Ser: 0.62 mg/dL (ref 0.44–1.00)
GFR calc Af Amer: >60 mL/min (ref >60)
GFR calc non Af Amer: >60 mL/min (ref >60)
Glucose, Bld: 95 mg/dL (ref 70–99)
Potassium: 3.7 mmol/L (ref 3.5–5.1)
Sodium: 142 mmol/L (ref 135–145)
Total Bilirubin: 0.3 mg/dL (ref 0.3–1.2)
Total Protein: 6.8 g/dL (ref 6.5–8.1)

## 2018-11-15 LAB — CBC
HCT: 37.3 % (ref 36.0–46.0)
Hemoglobin: 11.8 g/dL — ABNORMAL LOW (ref 12.0–15.0)
MCH: 28.6 pg (ref 26.0–34.0)
MCHC: 31.6 g/dL (ref 30.0–36.0)
MCV: 90.3 fL (ref 80.0–100.0)
Platelets: 233 10*3/uL (ref 150–400)
RBC: 4.13 MIL/uL (ref 3.87–5.11)
RDW: 14.3 % (ref 11.5–15.5)
WBC: 6.1 10*3/uL (ref 4.0–10.5)
nRBC: 0 % (ref 0.0–0.2)

## 2018-11-15 LAB — I-STAT BETA HCG BLOOD, ED (MC, WL, AP ONLY): I-stat hCG, quantitative: <5 m[IU]/mL (ref <5)

## 2018-11-15 LAB — TYPE AND SCREEN
ABO/RH(D): O POS
Antibody Screen: NEGATIVE

## 2018-11-15 LAB — ABO/RH: ABO/RH(D): O POS

## 2018-11-15 NOTE — ED Triage Notes (Signed)
Pt here for evaluation of abdominal pain and bright red rectal bleeding in the toilet this morning. Pt endorses nausea without vomiting, constipation early in the week and then diarrhea in the last few days. Pt sts she has been bloated and had stomach cramping but denies vaginal bleeding. Pt has IUD in place.

## 2018-11-15 NOTE — ED Notes (Signed)
Pt did not respond when called for vitals check 

## 2018-11-16 NOTE — ED Notes (Signed)
No answer in WR

## 2019-08-10 ENCOUNTER — Other Ambulatory Visit: Payer: Self-pay

## 2019-08-10 ENCOUNTER — Emergency Department (HOSPITAL_COMMUNITY)
Admission: EM | Admit: 2019-08-10 | Discharge: 2019-08-10 | Disposition: A | Discharge status: Home / Self Care | Attending: Emergency Medicine | Admitting: Emergency Medicine

## 2019-08-10 ENCOUNTER — Encounter (HOSPITAL_COMMUNITY): Payer: Self-pay | Admitting: *Deleted

## 2019-08-10 DIAGNOSIS — M62838 Other muscle spasm: Secondary | ICD-10-CM | POA: Insufficient documentation

## 2019-08-10 DIAGNOSIS — M542 Cervicalgia: Secondary | ICD-10-CM | POA: Diagnosis present

## 2019-08-10 MED ORDER — METHYLPREDNISOLONE 4 MG PO TBPK: ORAL_TABLET | ORAL | 0 refills | Status: AC

## 2019-08-10 NOTE — ED Provider Notes (Signed)
[facility] EMERGENCY DEPARTMENT Provider Note   CSN: [id number] Arrival date & time: 08/10/19  0630     History Chief Complaint  Patient presents with  . Neck Pain    Melissa Mathews is a 39 y.o. female.  The history is provided by the patient.  Neck Injury This is a new problem. The problem occurs daily. The problem has not changed since onset.Pertinent negatives include no chest pain, no abdominal pain, no headaches and no shortness of breath. The symptoms are aggravated by twisting. The symptoms are relieved by NSAIDs. Treatments tried: nsaids. The treatment provided mild relief.       Past Medical History:  Diagnosis Date  . Medical history non-contributory   . Migraine   . Migraine without aura, without mention of intractable migraine without mention of status migrainosus 03/31/2013  . 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 Sprain of neck 11/04/2013  . 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 Migraine without aura, without mention of intractable migraine without mention of status migrainosus 03/31/2013  . Normal spontaneous vaginal delivery 07/27/2012    Class: Status post  . Retained placenta 07/27/2012    Class: Status post  . S/P dilatation and curettage 07/27/2012    Class: Status post    Past Surgical History:  Procedure Laterality Date  . DILATION AND CURETTAGE OF UTERUS    . DILATION AND CURETTAGE OF UTERUS N/A 07/25/2012   Procedure: DILATATION AND CURETTAGE;  Surgeon: Turner Daniels, MD;  Location: [facility];  Service: Gynecology;  Laterality: N/A;  . NASAL FRACTURE SURGERY    . TONSILLECTOMY       OB History    Gravida  6   Para  3   Term  3   Preterm      AB  3   Living  3     SAB  3   TAB      Ectopic      Multiple      Live Births  3           Family History  Problem Relation Age of Onset  . Hyperlipidemia Mother   .  Migraines Mother   . Thyroid disease Mother   . Neurofibromatosis Mother   . Hyperlipidemia Father   . Cancer Maternal Grandmother   . Thyroid disease Maternal Grandmother   . Neurofibromatosis Maternal Grandmother   . Neurofibromatosis Brother   . Neurofibromatosis Other   . Thyroid disease Maternal Uncle     Social History   Tobacco Use  . Smoking status: Never Smoker  . Smokeless tobacco: Never Used  Substance Use Topics  . Alcohol use: Yes    Comment: 1-2 per week  . Drug use: No    Home Medications Prior to Admission medications   Medication Sig Start Date End Date Taking? Authorizing Provider  acetaminophen (TYLENOL) 500 MG tablet Take 1,000 mg by mouth every 6 (six) hours as needed for mild pain. For pain    [provider]  amoxicillin (AMOXIL) 500 MG capsule Take 1 capsule (500 mg total) by mouth 3 (three) times daily. 01/13/18   Elson Areas, PA-C  cyclobenzaprine (FLEXERIL) 5 MG tablet Take 1 tablet (5 mg total) by mouth 3 (three) times daily. Patient not taking: Reported on 02/24/2014 11/04/13   York Spaniel, MD  HYDROcodone-acetaminophen [facility]) 5-325 MG per tablet Take 2 tablets by mouth  every 6 (six) hours as needed. Must last 28 days Patient not taking: Reported on 02/24/2014 11/06/13   York Spaniel, MD  HYDROcodone-acetaminophen (NORCO/VICODIN) 5-325 MG per tablet Take 1 tablet by mouth every 6 (six) hours as needed for moderate pain or severe pain. 02/24/14   Sciacca, Marissa, PA-C  ibuprofen (ADVIL,MOTRIN) 800 MG tablet Take 800 mg by mouth every 8 (eight) hours as needed for mild pain.    [provider]  levonorgestrel (MIRENA) 20 MCG/24HR IUD 1 each by Intrauterine route once.    [provider]  methylPREDNISolone (MEDROL DOSEPAK) 4 MG TBPK tablet Follow package insert 08/10/19   Dijon Kohlman, DO  naproxen (NAPROSYN) 500 MG tablet Take 1 tablet (500 mg total) by mouth 2 (two) times daily with a meal. 05/05/15   Harris,  Abigail, PA-C  traMADol (ULTRAM) 50 MG tablet Take 1 tablet (50 mg total) by mouth every 6 (six) hours as needed. 01/13/18   Elson Areas, PA-C    Allergies    Patient has no known allergies.  Review of Systems   Review of Systems  Constitutional: Negative for fever.  Respiratory: Negative for shortness of breath.   Cardiovascular: Negative for chest pain.  Gastrointestinal: Negative for abdominal pain.  Musculoskeletal: Positive for neck pain and neck stiffness. Negative for arthralgias, back pain, gait problem, joint swelling and myalgias.  Skin: Negative for color change, pallor, rash and wound.  Neurological: Negative for dizziness, tremors, seizures, syncope, facial asymmetry, speech difficulty, weakness, light-headedness, numbness and headaches.    Physical Exam Updated Vital Signs Ht 5\' 2"  (1.575 m)   Wt 63 kg   BMI 25.40 kg/m   Physical Exam Constitutional:      General: She is not in acute distress.    Appearance: She is not ill-appearing.  HENT:     Head: Normocephalic.  Eyes:     Extraocular Movements: Extraocular movements intact.     Pupils: Pupils are equal, round, and reactive to light.  Neck:     Comments: Decreased range of motion rotationally at the cervical spine, no midline spinal tenderness, paraspinal cervical tenderness to the left with increased tone Cardiovascular:     Pulses: Normal pulses.  Musculoskeletal:        General: Tenderness present.     Cervical back: Tenderness present.     Comments: Tenderness within the left trapezius area with increased tone  Skin:    General: Skin is warm.     Capillary Refill: Capillary refill takes less than 2 seconds.     Findings: No rash.  Neurological:     General: No focal deficit present.     Mental Status: She is alert and oriented to person, place, and time.     Cranial Nerves: No cranial nerve deficit.     Sensory: No sensory deficit.     Motor: No weakness.     Comments: 5+ out of 5 strength  in upper extremities, normal sensation  Psychiatric:        Mood and Affect: Mood normal.     ED Results / Procedures / Treatments   Labs (all labs ordered are listed, but only abnormal results are displayed) Labs Reviewed - No data to display  EKG None  Radiology No results found.  Procedures Procedures (including critical care time)  Medications Ordered in ED Medications - No data to display  ED Course  I have reviewed the triage vital signs and the nursing notes.  Pertinent labs &  imaging results that were available during my care of the patient were reviewed by me and considered in my medical decision making (see chart for details).    MDM Rules/Calculators/A&P                      Shaquayla Klimas is a 38 year old female with no significant medical history presents the ED with left sided neck pain. No fever, no abnormalities in neurological exam. Normal strength and sensation of the upper extremities. Tenderness to the left trapezius/left paraspinal muscles of the cervical spine. Decreased range of motion with rotation at the cervical spine. No midline spinal tenderness. No trauma history. Pain after doing push-ups and working out. Likely some nerve irritation, muscle spasm. Has been taking naproxen and muscle relaxant with some relief. Has normal strength and sensation upper extremities. Having some paresthesias on the left arm but normal pulses in that arm. Compartments are soft. Will prescribe steroids. Patient denies pregnancy. We will have her follow-up with sports medicine if needed. Recommend exercises at home for range of motion and stretching. Given return precautions and discharged in ED in good condition.  This chart was dictated using voice recognition software.  Despite best efforts to proofread,  errors can occur which can change the documentation meaning.     Final Clinical Impression(s) / ED Diagnoses Final diagnoses:  Muscle spasms of neck    Rx /  DC Orders ED Discharge Orders         Ordered    methylPREDNISolone (MEDROL DOSEPAK) 4 MG TBPK tablet     08/10/19 0710           Lennice Sites, DO 08/10/19 [id number]

## 2019-08-10 NOTE — ED Triage Notes (Signed)
The pt has had lt neck and lt arm pain for 5 days she only can think of doing push-ups and may have sprained a muscle  lmp none bc

## 2020-03-03 LAB — LIPID PANEL
Chol/HDL Ratio: 4.7 — ABNORMAL HIGH (ref 0.0–4.4)
Cholesterol: 219 mg/dL — ABNORMAL HIGH (ref 100–200)
HDL: 47 mg/dL — ABNORMAL LOW (ref >=50)
LDL Cholesterol: 156.2 mg/dL — ABNORMAL HIGH (ref 0.0–100.0)
LDL/HDL Ratio: 3.3
Triglycerides: 79 mg/dL (ref 0–149)
VLDL: 15.8 mg/dL (ref 5.0–40.0)

## 2020-03-03 LAB — COMPREHENSIVE METABOLIC PANEL
ALT: 18 U/L (ref 0–33)
AST: 14 U/L (ref 0–32)
Albumin/Globulin Ratio: 2 mmol/L (ref 1.00–2.70)
Albumin: 4.3 g/dL (ref 3.5–5.2)
Alk Phosphatase: 63 U/L (ref 35–117)
Anion Gap: 14 mmol/L (ref 2–17)
BUN: 8 mg/dL (ref 6–20)
CO2: 22 mmol/L (ref 22–29)
Calcium: 8.9 mg/dL (ref 8.6–10.0)
Chloride: 102 mmol/L (ref 98–107)
Creatinine: 0.5 mg/dL (ref 0.5–1.0)
GFR African American: 139 mL/min/{1.73_m2} (ref >=90)
GFR Non-African American: 120 mL/min/{1.73_m2} (ref >=90)
Globulin: 2 g/dL (ref 1.9–4.4)
Glucose: 94 mg/dL (ref 70–99)
OSMOLALITY CALCULATED: 274 mOsm/kg (ref 270–287)
Potassium: 4.1 mmol/L (ref 3.5–5.3)
Sodium: 138 mmol/L (ref 135–145)
Total Bilirubin: 0.3 mg/dL (ref 0.00–1.20)
Total Protein: 6.5 g/dL (ref 6.4–8.3)

## 2021-10-09 ENCOUNTER — Ambulatory Visit
Admit: 2021-10-09 | Discharge: 2021-10-09 | Payer: TRICARE (CHAMPUS) | Attending: Family Medicine | Primary: Diagnostic Radiology
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
## 2021-10-09 NOTE — Patient Instructions (Addendum)
--   Ice daily in 20 minute increments.  -- Take 1000mg  tylenol and 400mg  ibuprofen 3 times daily as needed for pain. Take with food and full glass of water.  -- Close follow up with primary care doctor.  -- Present to ER for any new or worsening symptoms.

## 2021-10-10 NOTE — Telephone Encounter (Signed)
Error

## 2022-11-04 ENCOUNTER — Ambulatory Visit: Admit: 2022-11-04 | Discharge: 2022-11-04 | Payer: TRICARE (CHAMPUS) | Attending: Family | Primary: Diagnostic Radiology

## 2022-11-04 VITALS — BP 110/74 | HR 86 | Temp 98.90000°F | Resp 17 | Ht 62.0 in | Wt 147.0 lb

## 2022-11-04 DIAGNOSIS — U071 COVID-19: Secondary | ICD-10-CM

## 2022-11-04 LAB — AMB POC STREP GO A DIRECT, DNA PROBE: Strep pyogenes DNA, POC: NEGATIVE

## 2022-11-04 LAB — POCT COVID-19 RAPID, NAAT: SARS-COV-2, RdRp gene: POSITIVE — AB

## 2022-11-04 NOTE — Progress Notes (Signed)
 CHIEF COMPLAINT:  Chief Complaint   Patient presents with    Congestion    Cough        HISTORY OF PRESENT ILLNESS:    Nasal drainage, nasal congestion, cough, and sore throat.   Mild bodyaches but no fever chills  Symptoms started 2 to 3 days ago.   Sick/Covid contact: Patient exposed to several family members that had similar symptoms, but no known COVID or flu contacts  No nausea, vomiting.  Normal Bms.  No sob, chest pain, abdominal pain, abnormal urinary symptoms, dizziness or syncope.    Patient was vaccinated with COVID-vaccine with booster.  Patient had COVID-19, at the beginning of pandemic with very mild symptoms, per patient.    CURRENT MEDICATION LIST:    Current Outpatient Medications   Medication Sig Dispense Refill    atorvastatin (LIPITOR) 40 MG tablet Take 0.5 tablets by mouth      folic acid (FOLVITE) 1 MG tablet Take 1 tablet by mouth      levonorgestrel (MIRENA, 52 MG,) IUD 52 mg 1 each by IntraUTERine route once      rizatriptan (MAXALT-MLT) 10 MG disintegrating tablet 1 tablet Orally prn migraine for 30 days       No current facility-administered medications for this visit.        ALLERGIES:    No Known Allergies     HISTORY:  Past Medical History:   Diagnosis Date    Headache     Herniated disc, lumbar       Past Surgical History:   Procedure Laterality Date    DILATION AND CURETTAGE OF UTERUS      NOSE SURGERY      TONSILLECTOMY        Social History     Socioeconomic History    Marital status: Single     Spouse name: Not on file    Number of children: Not on file    Years of education: Not on file    Highest education level: Not on file   Occupational History    Not on file   Tobacco Use    Smoking status: Never    Smokeless tobacco: Never   Vaping Use    Vaping status: Never Used   Substance and Sexual Activity    Alcohol use: Never    Drug use: Never    Sexual activity: Not on file   Other Topics Concern    Not on file   Social History Narrative    Not on file     Social Determinants of  Health     Financial Resource Strain: Not on file   Food Insecurity: Not on file   Transportation Needs: Not on file   Physical Activity: Not on file   Stress: Not on file   Social Connections: Not on file   Intimate Partner Violence: Not on file   Housing Stability: Not on file      No family history on file.     REVIEW OF SYSTEMS:  Review of Systems   Constitutional:  Negative for chills and fever.   Respiratory:  Negative for shortness of breath.    Cardiovascular:  Negative for chest pain.   Neurological:  Negative for dizziness and syncope.        PHYSICAL EXAM:  Physical Exam  Constitutional:       General: She is not in acute distress.     Appearance: Normal appearance. She is not ill-appearing, toxic-appearing or diaphoretic.  HENT:      Head: Normocephalic and atraumatic.      Comments: No sinus tenderness with palpation     Ears:      Comments: External ear canals normal.  Bilateral TMs slightly red/clear/intact, with clear effusion. No mastoid tenderness or swelling.     Nose: Congestion and rhinorrhea present.      Mouth/Throat:      Comments: Posterior throat slightly inflamed  Eyes:      Conjunctiva/sclera: Conjunctivae normal.   Cardiovascular:      Rate and Rhythm: Normal rate and regular rhythm.   Pulmonary:      Effort: Pulmonary effort is normal.      Breath sounds: Normal breath sounds.   Musculoskeletal:         General: Normal range of motion.      Cervical back: Normal range of motion and neck supple. No rigidity or tenderness.   Lymphadenopathy:      Cervical: No cervical adenopathy.   Skin:     General: Skin is warm and dry.   Neurological:      General: No focal deficit present.      Mental Status: She is alert and oriented to person, place, and time.      Sensory: No sensory deficit.      Motor: No weakness.      Coordination: Coordination normal.      Gait: Gait normal.   Psychiatric:         Mood and Affect: Mood normal.         Behavior: Behavior normal.         Thought Content:  Thought content normal.         Judgment: Judgment normal.        Vital Signs -   Visit Vitals  BP 110/74   Pulse 86   Temp 98.9 F (37.2 C) (Oral)   Resp 17   Ht 1.575 m (5' 2)   Wt 66.7 kg (147 lb)   SpO2 99%   BMI 26.89 kg/m            LABS  Results for orders placed or performed in visit on 11/04/22   COVID Molecular (ID NOW)   Result Value Ref Range    SARS-COV-2, RdRp gene Positive (A) Negative    Lot Number [id number]     QC Pass/Fail pass    Strep A Rapid, NAAT DNA (ID NOW)   Result Value Ref Range    Valid Internal Control, POC pass     Strep pyogenes DNA, POC Negative Not Detected       TREATMENT:    1. COVID-19  2. Acute cough  -     COVID Molecular (ID NOW)  3. Sore throat  -     Strep A Rapid, NAAT DNA (ID NOW)  4. Body aches       EDUCATION:   Patient Instructions       --- Patient tested positive for COVID-19 in office today. No longer contagious once 24 hours fever free and improvement of overall symptoms    --- May take Sudafed as needed for nasal congestion/eustachian tube congestion.  Do not take Sudafed if you have high blood pressure     --- May take Mucinex (guaifenesin) for thick nasal congestion/nasal secretions. Stop Mucinex once nasal congestion has improved or if cough worsens, due to increased postnasal drip.       --- May taken Tylenol and/or ibuprofen, as needed  for fevers/body aches/pain. (Do not take ibuprofen, if you have history of Gi bleeds/ulcers, decrease kidney function and/or every been told not to take NSAIDs)      --- May use over the counter cough suppressant for cough, like Coricidin or Delsym (Dextromethorphan) (May use corcidin HBP if you have high blood pressure)      --- May use saline nasal rinses, Neti Pot, once a day as needed for nasal congestion/nasal drainage, with distilled water.      --- Saltwater gargles can help sore throat, 1/2 a teaspoon in a cup full of water.      --- Follow-up, in 7-10 days, if symptoms do not improve with treatment or anytime if  symptoms worsen    --- URI education printed    --- After discussion, mutually decided not to prescribe Paxlovid    Follow up and Dispositions:  Return if symptoms worsen or fail to improve.       Bailey CHRISTELLA Mayans, APRN - NP

## 2022-11-04 NOTE — Patient Instructions (Addendum)
---   Patient tested positive for COVID-19 in office today. No longer contagious once 24 hours fever free and improvement of overall symptoms    --- May take Sudafed as needed for nasal congestion/eustachian tube congestion.  Do not take Sudafed if you have high blood pressure     --- May take Mucinex (guaifenesin) for thick nasal congestion/nasal secretions. Stop Mucinex once nasal congestion has improved or if cough worsens, due to increased postnasal drip.       --- May taken Tylenol and/or ibuprofen, as needed for fevers/body aches/pain. (Do not take ibuprofen, if you have history of Gi bleeds/ulcers, decrease kidney function and/or every been told not to take NSAIDs)      --- May use over the counter cough suppressant for cough, like Coricidin or Delsym (Dextromethorphan) (May use corcidin HBP if you have high blood pressure)      --- May use saline nasal rinses, Neti Pot, once a day as needed for nasal congestion/nasal drainage, with distilled water.      --- Saltwater gargles can help sore throat, 1/2 a teaspoon in a cup full of water.      --- Follow-up, in 7-10 days, if symptoms do not improve with treatment or anytime if symptoms worsen    --- URI education printed

## 2023-04-06 ENCOUNTER — Ambulatory Visit: Admit: 2023-04-06 | Discharge: 2023-04-06 | Payer: TRICARE (CHAMPUS) | Attending: Family | Primary: Diagnostic Radiology

## 2023-04-06 DIAGNOSIS — K529 Noninfective gastroenteritis and colitis, unspecified: Secondary | ICD-10-CM

## 2023-04-06 LAB — AMB POC URINE PREGNANCY TEST, VISUAL COLOR COMPARISON: HCG, Pregnancy, Urine, POC: NEGATIVE

## 2023-04-06 LAB — AMB POC URINALYSIS DIP STICK AUTO W/O MICRO
Bilirubin, Urine, POC: NEGATIVE
Glucose, Urine, POC: NEGATIVE
Leukocyte Esterase, Urine, POC: NEGATIVE
Nitrite, Urine, POC: NEGATIVE
Protein, Urine, POC: 30
Specific Gravity, Urine, POC: 1.03 (ref 1.001–1.035)
Urobilinogen, POC: 0.2 mg/dL (ref <1.1)
pH, Urine, POC: 6 (ref 4.6–8.0)

## 2023-04-06 MED ORDER — ONDANSETRON 4 MG PO TBDP
4 | ORAL_TABLET | Freq: Four times a day (QID) | ORAL | 0 refills | Status: AC | PRN
Start: 2023-04-06 — End: 2023-04-11

## 2023-04-06 NOTE — Progress Notes (Signed)
Subjective:      Patient ID: Bailey Mckenzie     Chief Complaint Nausea, Vomiting, and Diarrhea       Time Patient seen by Provider: 4:27 PM      Vomiting   Associated symptoms include diarrhea.   Diarrhea   Associated symptoms include vomiting.    The patient is a 43 y.o. female presents with complaints of nausea, vomiting, diarrhea that began this morning.  She reports 3 episodes of vomiting and 4 episodes of diarrhea.  She states that the diarrheal stools are loose and not watery in nature.  No blood in stool.  Patient states that she felt warm, but no documented fever.  She has been experiencing nasal congestion, rhinitis, and a cough for over a week.  She was seen in outside emergency department at that time.  She states that she tested negative for COVID and flu, but was told that it might be too early to obtain accurate testing.  She was treated with a 7-day course of amoxicillin.  She states that she knows number of people with URI symptoms, but no one with GI symptoms like if she has been experiencing today.  No recent travel.  She denies dysuria and hematuria.  Has a history of ureterolithiasis and states that this feels very different.    Review of Systems   Gastrointestinal:  Positive for diarrhea and vomiting.   : As noted in the HPI    Past Medical History:   Diagnosis Date    Headache     Herniated disc, lumbar         Past Surgical History:   Procedure Laterality Date    DILATION AND CURETTAGE OF UTERUS      NOSE SURGERY      TONSILLECTOMY          No Known Allergies     Current Outpatient Medications   Medication Sig Dispense Refill    ondansetron (ZOFRAN-ODT) 4 MG disintegrating tablet Take 1 tablet by mouth every 6 hours as needed for Vomiting or Nausea 20 tablet 0    atorvastatin (LIPITOR) 40 MG tablet Take 0.5 tablets by mouth      folic acid (FOLVITE) 1 MG tablet Take 1 tablet by mouth      levonorgestrel (MIRENA, 52 MG,) IUD 52 mg 1 each by IntraUTERine route once      rizatriptan (MAXALT-MLT) 10  MG disintegrating tablet 1 tablet Orally prn migraine for 30 days       No current facility-administered medications for this visit.        BP 124/86 (Site: Right Upper Arm, Position: Sitting, Cuff Size: Large Adult)   Pulse 95   Temp 98.9 F (37.2 C) (Oral)   Resp 16   Ht 1.575 m (5\' 2" )   Wt 66.7 kg (147 lb)   SpO2 98%   BMI 26.89 kg/m       No results found.    Objective:   Physical Exam  Vitals and nursing note reviewed.   Constitutional:       General: She is awake. She is not in acute distress.     Appearance: Normal appearance. She is well-developed and well-groomed. She is not toxic-appearing.   HENT:      Head: Normocephalic and atraumatic.   Eyes:      Extraocular Movements: Extraocular movements intact.      Conjunctiva/sclera: Conjunctivae normal.   Cardiovascular:      Rate and Rhythm: Normal rate and  regular rhythm.      Heart sounds: Normal heart sounds. No murmur heard.     No friction rub. No gallop.   Pulmonary:      Effort: Pulmonary effort is normal. No respiratory distress.      Breath sounds: Normal breath sounds. No stridor. No wheezing, rhonchi or rales.   Abdominal:      General: Abdomen is flat. Bowel sounds are normal. There is no distension.      Palpations: Abdomen is soft. There is no mass.      Tenderness: There is abdominal tenderness (Mild tenderness over epigastrium and bilateral lower quadrants). There is right CVA tenderness and left CVA tenderness. There is no guarding or rebound.      Hernia: No hernia is present.   Musculoskeletal:         General: Normal range of motion.      Cervical back: Normal range of motion and neck supple.   Skin:     General: Skin is warm and dry.   Neurological:      General: No focal deficit present.      Mental Status: She is alert and oriented to person, place, and time.   Psychiatric:         Mood and Affect: Mood normal.         Behavior: Behavior normal. Behavior is cooperative.         Thought Content: Thought content normal.          Judgment: Judgment normal.           Assessment:   1. Gastroenteritis  2. Nausea and vomiting, unspecified vomiting type  -     AMB POC URINALYSIS DIP STICK AUTO W/O MICRO  -     AMB POC URINE PREGNANCY TEST, VISUAL COLOR COMPARISON  -     ondansetron (ZOFRAN-ODT) 4 MG disintegrating tablet; Take 1 tablet by mouth every 6 hours as needed for Vomiting or Nausea, Disp-20 tablet, R-0Normal  3. Diarrhea, unspecified type  4. Hematuria, unspecified type  -     Culture, Urine       Plan:     Results for orders placed or performed in visit on 04/06/23   AMB POC URINALYSIS DIP STICK AUTO W/O MICRO   Result Value Ref Range    Color (UA POC) Yellow     Clarity (UA POC) Clear     Glucose, Urine, POC Negative     Bilirubin, Urine, POC Negative     Ketones, Urine, POC Moderate     Specific Gravity, Urine, POC 1.030 1.001 - 1.035    Blood (UA POC) Moderate     pH, Urine, POC 6.0 4.6 - 8.0    Protein, Urine, POC 30     Urobilinogen, POC 0.2 mg/dL <1.6 mg/dL    Nitrite, Urine, POC Negative Negative    Leukocyte Esterase, Urine, POC Negative    AMB POC URINE PREGNANCY TEST, VISUAL COLOR COMPARISON   Result Value Ref Range    Valid Internal Control, POC PASS     HCG, Pregnancy, Urine, POC Negative       Medications prescribed as needed for symptom relief.  Discussed with the patient the importance of maintaining adequate hydration. May start bland diet after the patient has been able to tolerate fluids without difficulty for 24 hours. Start introducing foods slowly and with a bland diet or BRAT diet. Instructed the patient to seek immediate reevaluation if fever, worsening abdominal pain, inability to  maintain adequate hydration, or blood in stool develop.  Keep hands washed routinely and get plenty of rest.  It may take a week or longer to get back to your normal activity status.     Vernelle Emerald, APRN - NP    I have reviewed prior visit notes and lab results pertinent to this visit. Point of care results and imaging were  independently interpreted by myself and discussed with the patient. Educated the patient on disease process, expected course of illness, and management. Educated the patient on how to administer prescribed medications and possible side effects.  Patient instructed to follow-up immediately for any new or worsening symptoms.  Patient verbalized understanding and agreement with plan of care.    (Please note that portions of this note were completed with a voice recognition program.  Efforts were made to edit the dictations but occasionally words are mis-transcribed.)     (Note to patient: The 21st Century Cures Act requires that medical notes like this be available to patients in the interest of transparency. However, be advised this is a medical document. It is intended as peer to peer communication. It is written in medical language and may contain abbreviations or verbiage that are unfamiliar. It may appear blunt or direct. Medical documents are intended to carry relevant information, facts as evident, and the clinical opinion of the practitioner.)

## 2023-04-07 VITALS — BP 124/86 | HR 95 | Temp 98.90000°F | Resp 16 | Ht 62.0 in | Wt 147.0 lb

## 2023-04-07 LAB — CULTURE, URINE: FINAL REPORT: NO GROWTH

## 2023-04-07 NOTE — Other (Signed)
 Your recent urine culture results are normal.

## 2023-08-02 ENCOUNTER — Ambulatory Visit: Admit: 2023-08-02 | Discharge: 2023-08-02 | Payer: TRICARE (CHAMPUS) | Primary: Diagnostic Radiology

## 2023-08-02 ENCOUNTER — Ambulatory Visit
Admit: 2023-08-02 | Discharge: 2023-08-02 | Payer: TRICARE (CHAMPUS) | Attending: Family Medicine | Primary: Diagnostic Radiology

## 2023-08-02 VITALS — BP 122/72 | HR 82 | Temp 98.40000°F | Resp 18 | Ht 62.0 in | Wt 147.0 lb

## 2023-08-02 DIAGNOSIS — M546 Pain in thoracic spine: Secondary | ICD-10-CM

## 2023-08-02 MED ORDER — AZITHROMYCIN 250 MG PO TABS
250 | ORAL_TABLET | ORAL | 0 refills | 5.00000 days | Status: AC
Start: 2023-08-02 — End: 2023-08-12

## 2023-08-02 MED ORDER — TIZANIDINE HCL 4 MG PO TABS
4 | ORAL_TABLET | Freq: Every evening | ORAL | 0 refills | 16.00000 days | Status: AC | PRN
Start: 2023-08-02 — End: 2023-08-09

## 2023-08-02 NOTE — Progress Notes (Signed)
 Subjective:cough      Patient ID: Bailey Mckenzie       Shoulder Pain      The patient is a 43 y.o. female with cough congestion and drainage over past 2 weeks some left post mid back and lower back pain no radicular symptoms no fever.    Review of Systems: As noted in the HPI    Past Medical History:   Diagnosis Date    Headache     Herniated disc, lumbar         Past Surgical History:   Procedure Laterality Date    DILATION AND CURETTAGE OF UTERUS      NOSE SURGERY      TONSILLECTOMY          No Known Allergies     Current Outpatient Medications   Medication Sig Dispense Refill    albuterol sulfate HFA (PROVENTIL;VENTOLIN;PROAIR) 108 (90 Base) MCG/ACT inhaler Inhale into the lungs      clotrimazole-betamethasone (LOTRISONE) 1-0.05 % cream APPLY TOPICALLY TO THE AFFECTED AND SURROUNDING AREAS TWICE DAILY IN THE MORNING AND IN THE EVENING FOR 10 DAYS THEN AS NEEDED      dicyclomine (BENTYL) 10 MG capsule Take 1 capsule by mouth      ketotifen fumarate (ZADITOR) 0.035 % ophthalmic solution Apply to eye      fexofenadine (ALLEGRA) 180 MG tablet Take 1 tablet by mouth      lidocaine (LIDODERM) 5 % Place onto the skin      selenium sulfide (SELSUN) 2.5 % lotion Apply to affect area several times weekly. Allow to remain on skin for 5 - 10 minutes. Rinse thoroughly.      tiZANidine (ZANAFLEX) 4 MG tablet Take 1 tablet by mouth nightly as needed (spasm) 7 tablet 0    folic acid (FOLVITE) 1 MG tablet Take 1 tablet by mouth      levonorgestrel (MIRENA, 52 MG,) IUD 52 mg 1 each by IntraUTERine route once      rizatriptan (MAXALT-MLT) 10 MG disintegrating tablet 1 tablet Orally prn migraine for 30 days      atorvastatin (LIPITOR) 40 MG tablet Take 0.5 tablets by mouth (Patient not taking: Reported on 08/02/2023)       No current facility-administered medications for this visit.        BP 132/76   Pulse 82   Temp 98.4 F (36.9 C) (Oral)   Resp 18   Ht 1.575 m (5\' 2" )   Wt 66.7 kg (147 lb)   SpO2 97%   BMI 26.89 kg/m        Objective:   Physical Exam  Constitutional:       General: She is not in acute distress.     Appearance: Normal appearance. She is not ill-appearing, toxic-appearing or diaphoretic.   HENT:      Head: Normocephalic and atraumatic.      Right Ear: Tympanic membrane and ear canal normal.      Left Ear: Tympanic membrane and ear canal normal.      Nose: Nose normal.      Mouth/Throat:      Mouth: Mucous membranes are moist.      Pharynx: No oropharyngeal exudate or posterior oropharyngeal erythema.   Eyes:      General: No scleral icterus.        Right eye: No discharge.         Left eye: No discharge.      Extraocular Movements: Extraocular movements intact.  Conjunctiva/sclera: Conjunctivae normal.      Pupils: Pupils are equal, round, and reactive to light.   Cardiovascular:      Rate and Rhythm: Normal rate and regular rhythm.      Pulses: Normal pulses.      Heart sounds: Normal heart sounds.   Pulmonary:      Effort: Pulmonary effort is normal.      Breath sounds: Normal breath sounds.   Abdominal:      General: Abdomen is flat. Bowel sounds are normal. There is no distension.      Palpations: Abdomen is soft. There is no mass.      Tenderness: There is no abdominal tenderness. There is no right CVA tenderness or left CVA tenderness.      Hernia: No hernia is present.   Musculoskeletal:      Cervical back: Normal.      Thoracic back: Spasms and tenderness present. No swelling, edema, deformity, signs of trauma, lacerations or bony tenderness. Normal range of motion. No scoliosis.      Lumbar back: Spasms and tenderness present. No swelling, edema, deformity, signs of trauma, lacerations or bony tenderness. Normal range of motion. Negative right straight leg raise test and negative left straight leg raise test. No scoliosis.   Skin:     General: Skin is warm and dry.   Neurological:      General: No focal deficit present.      Mental Status: She is alert.   Psychiatric:         Mood and Affect: Mood  normal.         No scans are attached to the encounter.     Assessment:   1. Acute left-sided thoracic back pain  -     XR THORACIC SPINE (3 VIEWS); Future  -     XR LUMBAR SPINE (2-3 VIEWS); Future  2. Acute bilateral low back pain without sciatica  -     XR THORACIC SPINE (3 VIEWS); Future  -     XR LUMBAR SPINE (2-3 VIEWS); Future  3. Lower respiratory tract infection       Plan:   No results found for any visits on 08/02/23.   Zpak, mucinex cough meds muscle relaxants, stretch xrays no acute changes fu as needed worsening symptoms         Scherrie Curt, MD

## 2023-11-03 ENCOUNTER — Ambulatory Visit: Admit: 2023-11-03 | Discharge: 2023-11-03 | Payer: TRICARE (CHAMPUS) | Attending: Family | Primary: Diagnostic Radiology

## 2023-11-03 VITALS — BP 126/76 | HR 72 | Temp 98.70000°F | Resp 18 | Ht 62.0 in | Wt 152.0 lb

## 2023-11-03 DIAGNOSIS — M545 Low back pain, unspecified: Principal | ICD-10-CM

## 2023-11-03 MED ORDER — TIZANIDINE HCL 4 MG PO TABS
4 | ORAL_TABLET | Freq: Three times a day (TID) | ORAL | 0 refills | Status: AC | PRN
Start: 2023-11-03 — End: 2023-11-08

## 2023-11-03 MED ORDER — METHYLPREDNISOLONE 4 MG PO TBPK
4 | PACK | ORAL | 0 refills | Status: AC
Start: 2023-11-03 — End: ?
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
